# Patient Record
Sex: Female | Born: 1995 | Hispanic: Yes | Marital: Single | Smoking: Former smoker
Health system: Southern US, Community
[De-identification: ages and names within clinical notes are randomized; demographics above are authoritative.]

## PROBLEM LIST (undated history)

## (undated) ENCOUNTER — Inpatient Hospital Stay: Payer: Self-pay

## (undated) DIAGNOSIS — O139 Gestational [pregnancy-induced] hypertension without significant proteinuria, unspecified trimester: Secondary | ICD-10-CM

## (undated) DIAGNOSIS — O4290 Premature rupture of membranes, unspecified as to length of time between rupture and onset of labor, unspecified weeks of gestation: Secondary | ICD-10-CM

## (undated) DIAGNOSIS — O1400 Mild to moderate pre-eclampsia, unspecified trimester: Secondary | ICD-10-CM

## (undated) HISTORY — PX: NO PAST SURGERIES: SHX2092

## (undated) HISTORY — DX: Premature rupture of membranes, unspecified as to length of time between rupture and onset of labor, unspecified weeks of gestation: O42.90

## (undated) HISTORY — PX: CHOLECYSTECTOMY: SHX55

## (undated) HISTORY — DX: Mild to moderate pre-eclampsia, unspecified trimester: O14.00

---
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
## 2014-11-10 IMAGING — US US OB < 14 WEEKS
1 series · 14 of 28 positions shown · non-contrast
Comparison: none

REASON FOR EXAM: Fetal heart beat
COMMENTS:   May transport without cardiac monitor

[Series 1: us ob < 14 weeks · 0.23mm/px · 14 of 46 slices shown]
[im 2/46]
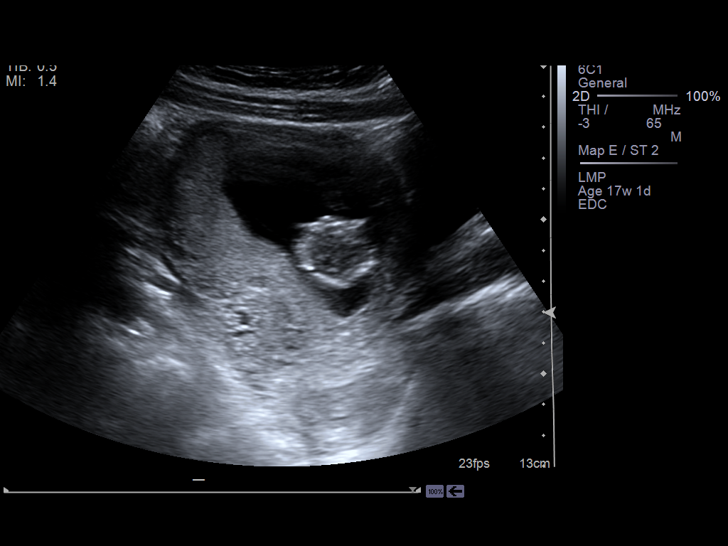
[im 6/46]
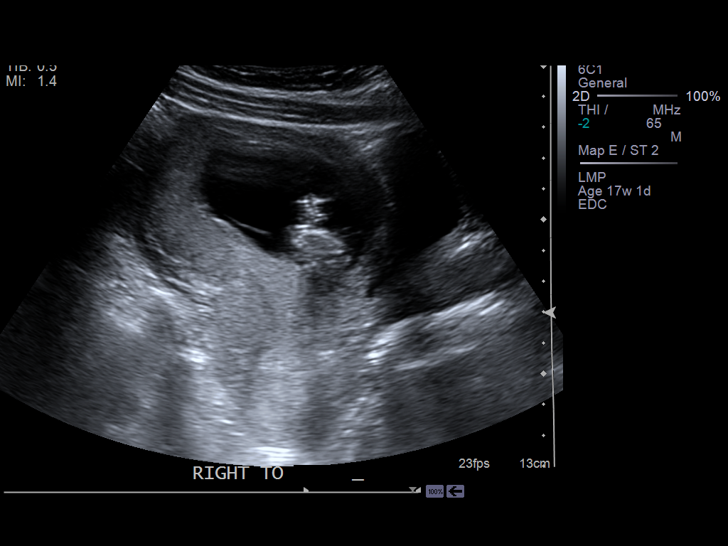
[im 9/46]
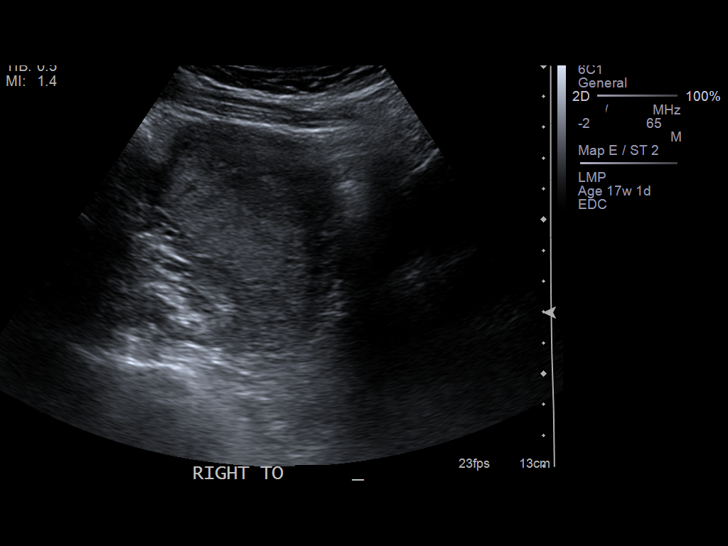
[im 12/46]
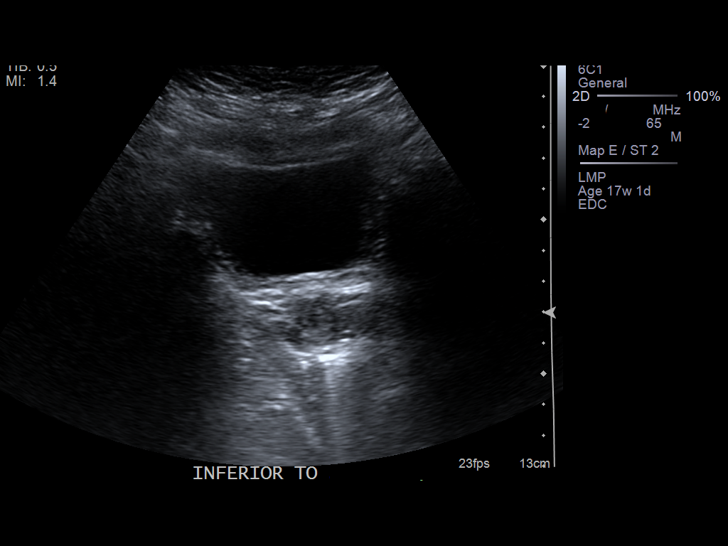
[im 16/46]
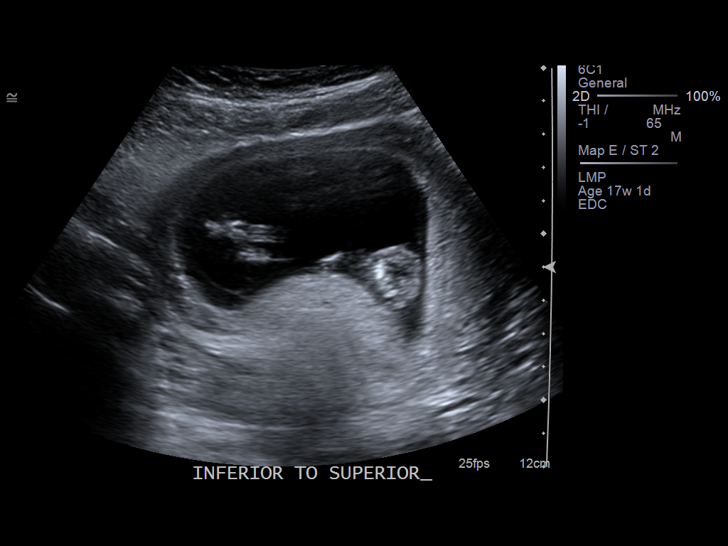
[im 19/46]
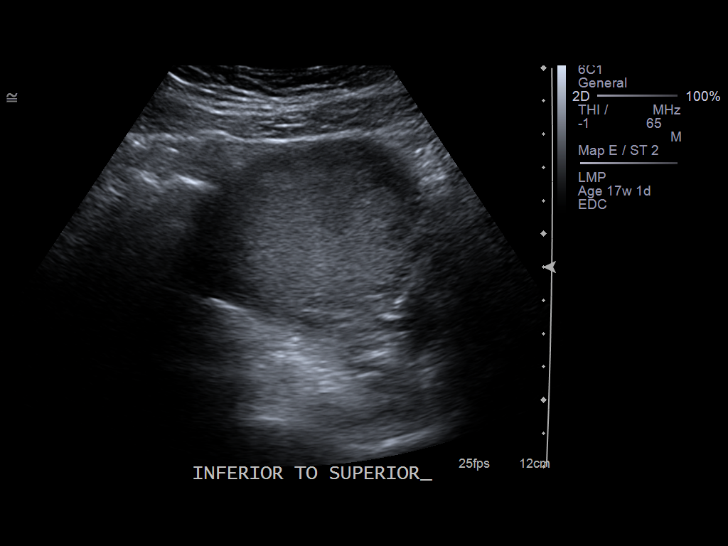
[im 22/46]
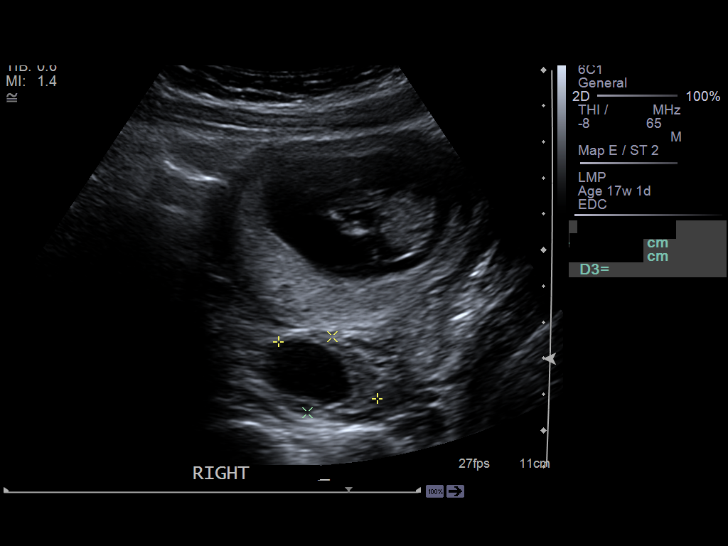
[im 26/46]
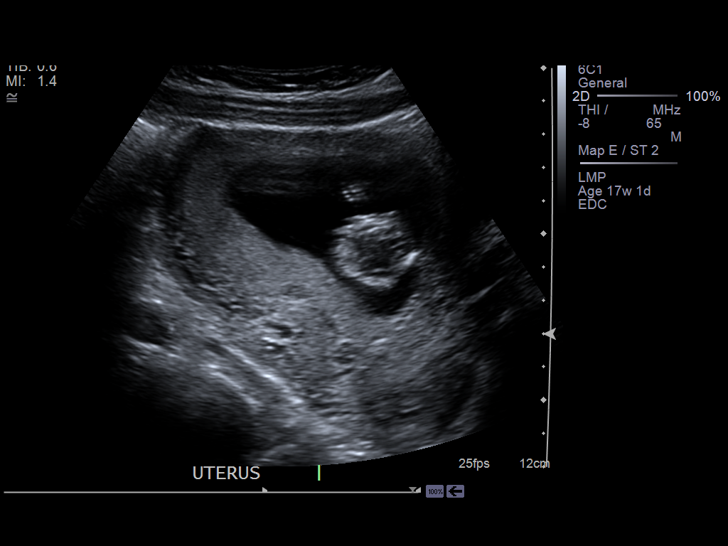
[im 29/46]
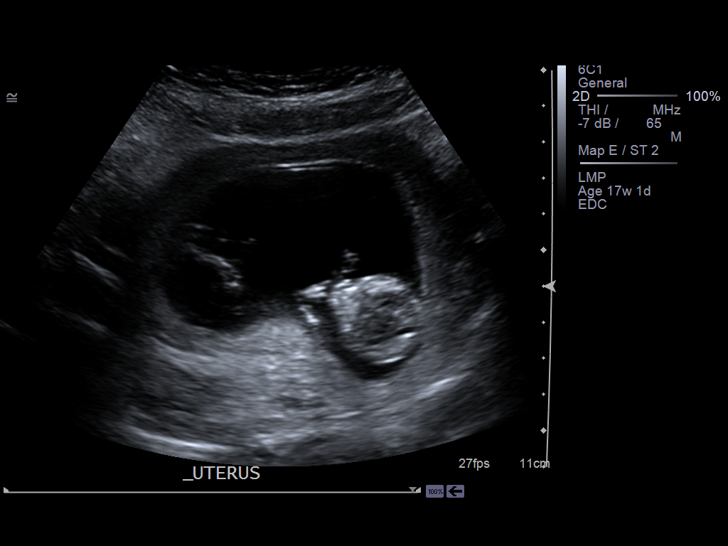
[im 32/46]
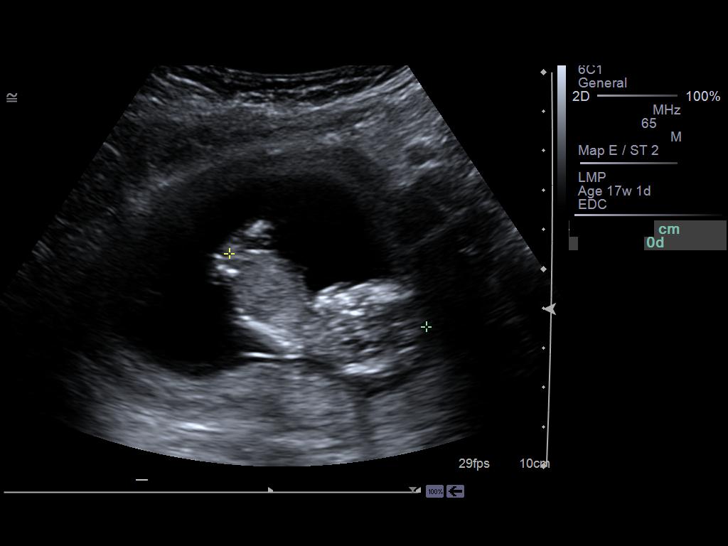
[im 36/46]
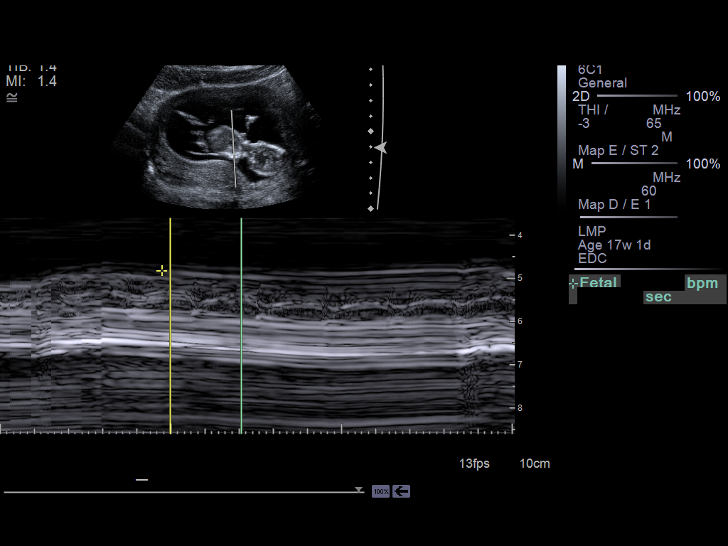
[im 39/46]
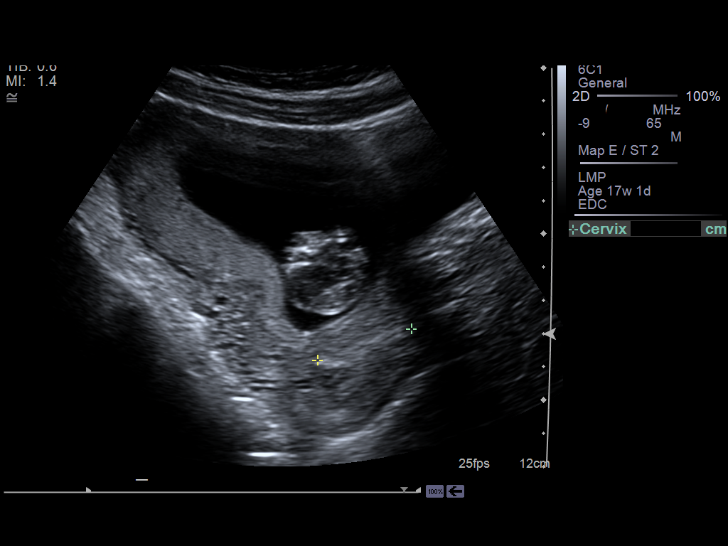
[im 42/46]
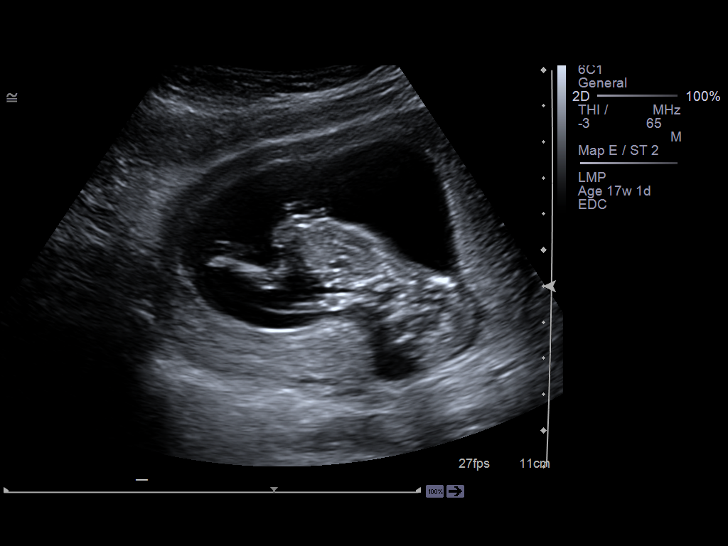
[im 46/46]
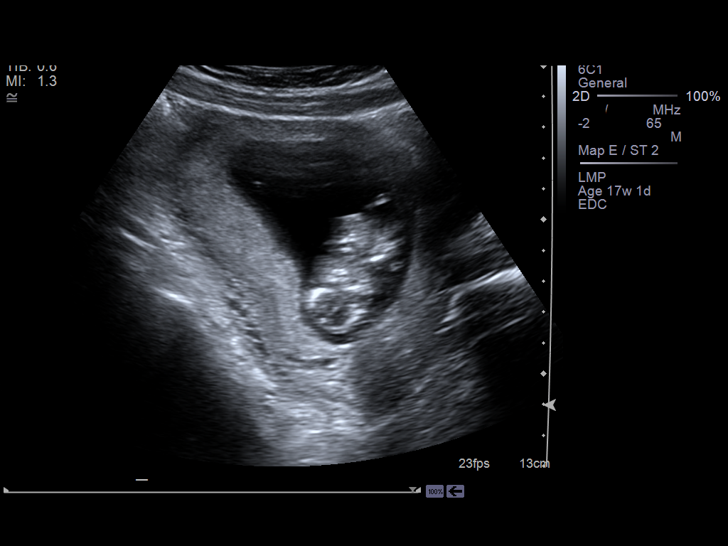

[14 of 28 positions shown; findings below may reference images not displayed]

PROCEDURE:     US  - US OB LESS THAN 14 WEEKS  - June 15, 2013  [DATE]

RESULT:     Transabdominal imaging was performed of the gravid uterus. There
is a viable IUP with a crown-rump length of 5.5 cm corresponding to 12 week
1 day gestation. A cardiac rate of 144 beats per minute was demonstrated. A
yolk sac is demonstrated as well. The maternal right ovary measures 3.2 x
2.2 x 2.6 cm and has an associated 2.5 x 1.6 x 1.7 cm diameter cyst. The
left ovary was not demonstrated. There is no free fluid in the cul-de-sac.
IMPRESSION: 1. There is a viable IUP with estimated gestational age of 12 weeks 1 day
+ / -8 days. This corresponds to an estimated date of confinement [DATE]. The maternal right ovary contains a 2.5 maximal dimension cyst. The left
ovary was not visualized.

A preliminary report was sent to the [HOSPITAL] the conclusion
of the study.

[REDACTED]

## 2014-12-23 NOTE — L&D Delivery Note (Cosign Needed)
Delivery Note At  a viable and healthy female was delivered via  (Presentation:LOA ;  ).  APGAR:8 ,9 ; weight  .  6#8oz Placenta status:delivered duncan with loose NCx1, and 3 vessel Cord:  with the following complications:lower uterine atony and increased bleeding, resolved with bimanual pressure and 2000mcg of cytotec rectally Anesthesia:none   Episiotomy:  none Lacerations:  none Suture Repair: NA Est. Blood Loss (mL):   400 Mom to postpartum.  Baby to Couplet care / Skin to Skin.  Melody N Shambley 12/10/2015, 2:50 PM

## 2015-05-02 NOTE — H&P (Signed)
L&D Evaluation:  History:  HPI 19yo SHF sent over from office for IOL at 2616w1d G1P0000 with mild preeclampsia and SROM 70h ago of scant amount clear fluid; denies fever, NTZ? and fern + in office, scant clear fluid noted on sterile speculum exam, MAD aware of findings and will assume care.   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  HEENT, CNS, GI, GU, Respiratory, CV, Renal and Musculoskeletal systems were found to be normal.   Exam:  Vital Signs stable   Urine Protein 4+   General no apparent distress   Mental Status clear   Chest clear   Heart normal sinus rhythm   Abdomen gravid, non-tender   Estimated Fetal Weight Average for gestational age   Fetal Position vtx   Back no CVAT   Edema 1+   Reflexes 2+   Clonus negative   Pelvic other, 2/60/-2   Mebranes Ruptured   Description clear   FHT normal rate with no decels   Fetal Heart Rate 126   Ucx irregular   Ucx Frequency 5 min   Length of each Contraction 60 seconds   Skin dry   Impression:  Impression reactive NST, SPROM at 39 weeks; mild preeclampsia   Plan:  Plan monitor contractions and for cervical change, PIH panel, antibiotics for GBBS prophylaxis, pitocin IOL   Electronic Signatures: Ines BloomerBurr, Melody N (CNM)  (Signed 26-Dec-14 14:32)  Authored: L&D Evaluation   Last Updated: 26-Dec-14 14:32 by Ulyses AmorBurr, Melody N (CNM)

## 2015-05-02 NOTE — H&P (Signed)
L&D Evaluation:  History:  HPI 19 y.o. G1P0 at 38.0 weeks, EDD 12/27/12 (LMP 03/13/13), elevated BP in office (health department 130/86), rule out PIH. Urine with 2+ protein   Presents with elevated BP, 2+  urine protein   Patient's Medical History No Chronic Illness   Patient's Surgical History none   Medications Pre Natal Vitamins   Allergies NKDA   Social History none   Family History Non-Contributory   ROS:  ROS intermittent contractions, mild x 3 days   General normal   HEENT normal   CNS normal   GI normal   GU normal   Resp normal   CV normal   Renal normal   MS normal   Exam:  Vital Signs stable   Urine Protein 2+   General no apparent distress   Mental Status clear   Chest clear   Heart normal sinus rhythm   Abdomen gravid, non-tender   Estimated Fetal Weight Average for gestational age   Fetal Position longitudinal lie,  cephalic   Back no CVAT   Edema no edema   Reflexes 2+   Clonus negative   Pelvic no external lesions, cervix  1cm/thick/posterior   Mebranes Intact   FHT normal rate with no decels   Fetal Heart Rate 155   Ucx irregular   Ucx Frequency -15 min   Skin dry   Lymph no lymphadenopathy   Impression:  Impression evaluation for PIH, reactive NST   Plan:  Plan UA, EFM/NST, monitor BP, PIH panel, discharge   Comments Urine protein 194. Protein/Cr ration 1254. PIH labs neg.  Uric acid 4.8.  BPs in triage 110s-120s/70s.  To perform 24 hr urine protein outpatient.  To f/u on Friday at [facility] for BP check as [facility] will be closed until the following Monday.  Plans to deliver with [facility].  Labor precautions and pre-eclampsia precautions given.   Follow Up Appointment in 1-3 days for BP check.   Electronic Signatures: Fabian Novemberherry, Depaul Arizpe S (MD)  (Signed 22-Dec-14 16:15)  Authored: L&D Evaluation   Last Updated: 22-Dec-14 16:15 by Fabian Novemberherry, Maliah Pyles S (MD)

## 2015-05-30 ENCOUNTER — Ambulatory Visit: Payer: Self-pay | Admitting: *Deleted

## 2015-05-30 VITALS — BP 119/74 | HR 79 | Ht 60.0 in | Wt 160.7 lb

## 2015-05-30 DIAGNOSIS — Z3491 Encounter for supervision of normal pregnancy, unspecified, first trimester: Secondary | ICD-10-CM

## 2015-05-30 LAB — OB RESULTS CONSOLE ABO/RH: RH TYPE: POSITIVE

## 2015-05-30 LAB — OB RESULTS CONSOLE RUBELLA ANTIBODY, IGM: RUBELLA: IMMUNE

## 2015-05-30 LAB — OB RESULTS CONSOLE VARICELLA ZOSTER ANTIBODY, IGG: VARICELLA IGG: IMMUNE

## 2015-05-30 NOTE — Progress Notes (Signed)
PT IS HERE FOR NOB NURSE INTAKE

## 2015-05-31 ENCOUNTER — Encounter: Payer: Self-pay | Admitting: Obstetrics and Gynecology

## 2015-06-01 LAB — PRENATAL PROFILE I(LABCORP)
ANTIBODY SCREEN: NEGATIVE
Basophils Absolute: 0 10*3/uL (ref 0.0–0.2)
Basos: 0 %
EOS (ABSOLUTE): 0.1 10*3/uL (ref 0.0–0.4)
Eos: 1 %
HEMATOCRIT: 35.2 % (ref 34.0–46.6)
Hemoglobin: 12.7 g/dL (ref 11.1–15.9)
Hepatitis B Surface Ag: NEGATIVE
Immature Grans (Abs): 0 10*3/uL (ref 0.0–0.1)
Immature Granulocytes: 0 %
Lymphocytes Absolute: 2 10*3/uL (ref 0.7–3.1)
Lymphs: 30 %
MCH: 30.4 pg (ref 26.6–33.0)
MCHC: 36.1 g/dL — ABNORMAL HIGH (ref 31.5–35.7)
MCV: 84 fL (ref 79–97)
Monocytes Absolute: 0.4 10*3/uL (ref 0.1–0.9)
Monocytes: 6 %
Neutrophils Absolute: 4.2 10*3/uL (ref 1.4–7.0)
Neutrophils: 63 %
PLATELETS: 210 10*3/uL (ref 150–379)
RBC: 4.18 x10E6/uL (ref 3.77–5.28)
RDW: 13.5 % (ref 12.3–15.4)
RPR: NONREACTIVE
Rh Factor: POSITIVE
Rubella Antibodies, IGG: 13 index (ref >0.99)
WBC: 6.7 10*3/uL (ref 3.4–10.8)

## 2015-06-01 LAB — MICROSCOPIC EXAMINATION
Bacteria, UA: NONE SEEN
Casts: NONE SEEN /lpf

## 2015-06-02 LAB — URINE CULTURE, REFLEX: Organism ID, Bacteria: NO GROWTH

## 2015-06-02 LAB — HGB A1C W/O EAG: Hgb A1c MFr Bld: 5.5 % (ref 4.8–5.6)

## 2015-06-02 LAB — ANTIBODY SCREEN: Antibody Screen: NEGATIVE

## 2015-06-02 LAB — UA/M W/RFLX CULTURE, ROUTINE

## 2015-06-02 LAB — VARICELLA ZOSTER ANTIBODY, IGG: Varicella zoster IgG: 1016 index (ref >165)

## 2015-06-04 LAB — GC/CHLAMYDIA PROBE AMP
Chlamydia trachomatis, NAA: NEGATIVE
NEISSERIA GONORRHOEAE BY PCR: NEGATIVE

## 2015-06-06 LAB — SPECIMEN STATUS REPORT

## 2015-06-09 ENCOUNTER — Encounter: Payer: Self-pay | Admitting: Obstetrics and Gynecology

## 2015-06-09 ENCOUNTER — Ambulatory Visit (INDEPENDENT_AMBULATORY_CARE_PROVIDER_SITE_OTHER): Payer: Self-pay | Admitting: Obstetrics and Gynecology

## 2015-06-09 VITALS — BP 103/69 | HR 85 | Wt 160.3 lb

## 2015-06-09 DIAGNOSIS — Z3492 Encounter for supervision of normal pregnancy, unspecified, second trimester: Secondary | ICD-10-CM

## 2015-06-09 DIAGNOSIS — O219 Vomiting of pregnancy, unspecified: Secondary | ICD-10-CM | POA: Insufficient documentation

## 2015-06-09 DIAGNOSIS — Z3482 Encounter for supervision of other normal pregnancy, second trimester: Secondary | ICD-10-CM

## 2015-06-09 DIAGNOSIS — E669 Obesity, unspecified: Secondary | ICD-10-CM

## 2015-06-09 DIAGNOSIS — O99210 Obesity complicating pregnancy, unspecified trimester: Secondary | ICD-10-CM | POA: Insufficient documentation

## 2015-06-09 DIAGNOSIS — Z8759 Personal history of other complications of pregnancy, childbirth and the puerperium: Secondary | ICD-10-CM

## 2015-06-09 DIAGNOSIS — O99212 Obesity complicating pregnancy, second trimester: Secondary | ICD-10-CM

## 2015-06-09 NOTE — Patient Instructions (Signed)
  Place 10-18 weeks prenatal visit patient instructions here.  

## 2015-06-09 NOTE — Progress Notes (Signed)
Pt is c/o nausea, took diclegis didn't feel like it helped

## 2015-06-09 NOTE — Progress Notes (Signed)
Subjective:  Lisa Dominguez is a 19 y.o. G2P1001 at [redacted]w[redacted]d being seen today for ongoing prenatal care.  Patient reports fatigue.daily nausea and vomiting relieved with Diclegis   .   .  .   The following portions of the patient's history were reviewed and updated as appropriate: allergies, current medications, past family history, past medical history, past social history, past surgical history and problem list. G2P1001 with previous pregnancy complicated by PIH with IOL at term and well vaginal delivery of female infant.  Objective:   Filed Vitals:   06/09/15 0921  BP: 103/69  Pulse: 85  Weight: 160 lb 4.8 oz (72.712 kg)    Fetal Status:  FHR 160s, movement heard         General:  Alert, oriented and cooperative. Patient is in no acute distress.  Skin: Skin is warm and dry. No rash noted.   Cardiovascular: Normal heart rate noted  Respiratory: Effort and breath sounds normal, no problems with respiration noted  Abdomen: Soft, gravid, appropriate for gestational age.       Vaginal:  .       Cervix: closed  Extremities: Normal range of motion.     Mental Status: Normal mood and affect. Normal behavior. Normal judgment and thought content.   Urinalysis:      Assessment and Plan:  Pregnancy: G2P1001 at [redacted]w[redacted]d  1. Prenatal care, second trimester H/O PIH at term- watch BPs Nausea & Vomiting of pregnancy- to continue Diclegis - POCT urinalysis dipstick Declines genetic screening   Please refer to After Visit Summary for other counseling recommendations.   F/U in 4 weeks for ROB visit  Melody Elissa Lovett, CNM

## 2015-07-07 ENCOUNTER — Encounter: Payer: Self-pay | Admitting: Obstetrics and Gynecology

## 2015-07-07 ENCOUNTER — Ambulatory Visit (INDEPENDENT_AMBULATORY_CARE_PROVIDER_SITE_OTHER): Payer: Self-pay | Admitting: Obstetrics and Gynecology

## 2015-07-07 VITALS — BP 109/69 | HR 85 | Wt 161.2 lb

## 2015-07-07 DIAGNOSIS — Z3402 Encounter for supervision of normal first pregnancy, second trimester: Secondary | ICD-10-CM

## 2015-07-07 LAB — POCT URINALYSIS DIPSTICK
Bilirubin, UA: NEGATIVE
Glucose, UA: NEGATIVE
Ketones, UA: NEGATIVE
NITRITE UA: NEGATIVE
PH UA: 6.5
Spec Grav, UA: 1.025
UROBILINOGEN UA: 1

## 2015-07-07 MED ORDER — DOXYLAMINE-PYRIDOXINE 10-10 MG PO TBEC: 1.0000 | DELAYED_RELEASE_TABLET | ORAL | Status: DC | PRN

## 2015-07-07 NOTE — Progress Notes (Signed)
ROB- doing better with nausea but still having to take Diclegis nightly- refill sent in; reports sinus pressure and clear nasal drainage- to take tylenol cold & sinus prn; anatomy scan scheduled.

## 2015-07-25 ENCOUNTER — Other Ambulatory Visit: Payer: Self-pay

## 2015-07-25 ENCOUNTER — Ambulatory Visit: Payer: Medicaid Other

## 2015-07-25 DIAGNOSIS — Z3402 Encounter for supervision of normal first pregnancy, second trimester: Secondary | ICD-10-CM

## 2015-08-04 ENCOUNTER — Encounter: Payer: Self-pay | Admitting: Obstetrics and Gynecology

## 2015-08-08 ENCOUNTER — Ambulatory Visit (INDEPENDENT_AMBULATORY_CARE_PROVIDER_SITE_OTHER): Payer: Self-pay | Admitting: Obstetrics and Gynecology

## 2015-08-08 VITALS — BP 108/67 | HR 86 | Wt 164.0 lb

## 2015-08-08 DIAGNOSIS — Z369 Encounter for antenatal screening, unspecified: Secondary | ICD-10-CM

## 2015-08-08 DIAGNOSIS — Z1389 Encounter for screening for other disorder: Secondary | ICD-10-CM

## 2015-08-08 DIAGNOSIS — Z331 Pregnant state, incidental: Secondary | ICD-10-CM

## 2015-08-08 DIAGNOSIS — Z36 Encounter for antenatal screening of mother: Secondary | ICD-10-CM

## 2015-08-08 LAB — POCT URINALYSIS DIPSTICK
Bilirubin, UA: NEGATIVE
Blood, UA: NEGATIVE
GLUCOSE UA: NEGATIVE
Ketones, UA: NEGATIVE
NITRITE UA: NEGATIVE
Protein, UA: NEGATIVE
Spec Grav, UA: 1.015
Urobilinogen, UA: NEGATIVE
pH, UA: 7

## 2015-08-08 NOTE — Progress Notes (Signed)
ROB- reviewed anatomy scan done 2 weeks ago with normal findings: Indications:Anatomy U/S Findings:  Singleton intrauterine pregnancy is visualized with FHR at 140 BPM. Biometrics give an (U/S) Gestational age of 81 5/7 weeks and an (U/S) EDD of 12-21-15; this correlates with the clinically established EDD of 12-15-15.  Fetal presentation is Oblique..  EFW: 9 oz. Placenta: Posterior-fundal. AFI: Adequate.  Anatomic survey is complete and normal; Gender - female .     Impression: 1. 18 5/7 week Viable Singleton Intrauterine pregnancy by U/S. 2. (U/S) EDD is consistent with Clinically established (LMP) EDD of 12-15-15. (6 day difference). 3. Normal Anatomy Scan  Doing well, needs to increase water intake.

## 2015-09-05 ENCOUNTER — Ambulatory Visit (INDEPENDENT_AMBULATORY_CARE_PROVIDER_SITE_OTHER): Payer: Medicaid Other | Admitting: Obstetrics and Gynecology

## 2015-09-05 ENCOUNTER — Encounter: Payer: Self-pay | Admitting: Obstetrics and Gynecology

## 2015-09-05 VITALS — BP 110/69 | HR 84 | Wt 169.6 lb

## 2015-09-05 DIAGNOSIS — Z3493 Encounter for supervision of normal pregnancy, unspecified, third trimester: Secondary | ICD-10-CM | POA: Diagnosis not present

## 2015-09-05 MED ORDER — INFLUENZA VAC SPLIT QUAD 0.5 ML IM SUSY
0.5000 mL | PREFILLED_SYRINGE | Freq: Once | INTRAMUSCULAR | Status: AC
  Administered 2015-09-05: 0.5 mL via INTRAMUSCULAR

## 2015-09-05 NOTE — Progress Notes (Signed)
ROB- denies any new complaints, flu shot given 09/05/15

## 2015-09-05 NOTE — Progress Notes (Signed)
ROB-doing well, glucola next visit. 

## 2015-09-10 ENCOUNTER — Observation Stay
Admission: EM | Admit: 2015-09-10 | Discharge: 2015-09-10 | Disposition: A | Discharge status: Home / Self Care | Payer: Medicaid Other | Attending: Obstetrics and Gynecology | Admitting: Obstetrics and Gynecology

## 2015-09-10 DIAGNOSIS — Z3A26 26 weeks gestation of pregnancy: Secondary | ICD-10-CM | POA: Insufficient documentation

## 2015-09-10 DIAGNOSIS — O26892 Other specified pregnancy related conditions, second trimester: Principal | ICD-10-CM | POA: Insufficient documentation

## 2015-09-10 DIAGNOSIS — R109 Unspecified abdominal pain: Secondary | ICD-10-CM | POA: Diagnosis present

## 2015-09-10 DIAGNOSIS — R1033 Periumbilical pain: Secondary | ICD-10-CM | POA: Diagnosis present

## 2015-09-10 HISTORY — DX: Gestational (pregnancy-induced) hypertension without significant proteinuria, unspecified trimester: O13.9

## 2015-09-10 NOTE — OB Triage Note (Signed)
Discharged home.  Discussed use of abdominal band to help support umbilical area.  Patient is going to go purchase one.  Will follow-up in two weeks with Zoraida Havrilla.

## 2015-09-10 NOTE — OB Triage Note (Signed)
19 y.o. female presents today with complaint of pain in her umbilical area.  States that this started last night when she was cooking she leaned over the oven and her belly button hit it, which made it more sore. Her umbilical area has been protruding slightly for a couple weeks per the patient. States that this is a 7/10 on pain scale. She states that makes laying down makes it better and standing makes it worse. At home has tried nothing to make it better.  Denies abdominal pain, bleeding, leaking of fluid, or decreased fetal movement.  On assessment umbilicus slightly protruding and tender to touch.  Discussed with patient that wearing a belly band can help with this. Spoke with Melody Burr CNM and orders received to discharge home.

## 2015-09-10 NOTE — ED Notes (Signed)
Called and talked with Daniel Nones in L&D,  Because of pts c/o of decreased fetal movement today pt will need to go to L&D.

## 2015-09-10 NOTE — ED Notes (Signed)
Pt is 26 weeks, 2 days pregnant.  Here with c/o pain around her umbilicus.  She reports that she leaned in over the stove last night pressing on her abdomen, and the pain started after that.   She states that today the pain is worse, and pain is especially worse with movement.   No bruising noted, pt denies nausea, vomiting.   She has felt some fetal movement today, but not as much as usual.  FHT 144.

## 2015-09-27 ENCOUNTER — Encounter: Payer: Self-pay | Admitting: Obstetrics and Gynecology

## 2015-09-27 ENCOUNTER — Ambulatory Visit (INDEPENDENT_AMBULATORY_CARE_PROVIDER_SITE_OTHER): Payer: Medicaid Other | Admitting: Obstetrics and Gynecology

## 2015-09-27 VITALS — BP 109/65 | HR 97 | Wt 172.5 lb

## 2015-09-27 DIAGNOSIS — Z23 Encounter for immunization: Secondary | ICD-10-CM

## 2015-09-27 DIAGNOSIS — Z3493 Encounter for supervision of normal pregnancy, unspecified, third trimester: Secondary | ICD-10-CM | POA: Diagnosis not present

## 2015-09-27 DIAGNOSIS — Z131 Encounter for screening for diabetes mellitus: Secondary | ICD-10-CM

## 2015-09-27 MED ORDER — TETANUS-DIPHTH-ACELL PERTUSSIS 5-2.5-18.5 LF-MCG/0.5 IM SUSP
0.5000 mL | Freq: Once | INTRAMUSCULAR | Status: AC
  Administered 2015-09-27: 0.5 mL via INTRAMUSCULAR

## 2015-09-27 NOTE — Patient Instructions (Signed)
Vacuna Td (contra la difteria y el ttanos): Lo que debe saber (Td Vaccine [place] and Diphtheria]: What You Need to Know) 1. Por qu vacunarse? El ttanos y la difteria son enfermedades muy graves. Son Academic librarian frecuentes en los [place] actualmente, pero las personas que se infectan suelen tener complicaciones graves. La vacuna Td se [place] para proteger a los adolescentes y a los adultos de ambas enfermedades. Tanto el ttanos como la difteria son infecciones causadas por bacterias. La difteria se transmite de persona a persona a travs de la tos o el estornudo. La bacteria que causa el ttanos entra al cuerpo a travs de cortes, raspones o heridas. El TTANOS (trismo) provoca entumecimiento y Engineer, materials dolorosa de los msculos, por lo general, en todo el cuerpo.  Puede causar el endurecimiento de los msculos de la cabeza y el cuello, de modo que impide abrir la boca, tragar y en algunos casos, Industrial/product designer. El ttanos es causa de muerte en aproximadamente 1de cada 10personas que contraen la infeccin, incluso despus de que reciben la mejor atencin mdica. La DIFTERIA puede hacer que se forme una membrana gruesa en la parte posterior de la garganta.  Puede causar problemas respiratorios, parlisis, insuficiencia cardaca e incluso la muerte. Antes de las vacunas, en los [place] se informaban 200000 casos de difteria y cientos de casos de ttanos cada ao. Desde que comenz la vacunacin, los informes de casos de ambas enfermedades se han reducido en un 99%. 2. Lisa Dominguez Td La vacuna Td protege a adolescentes y adultos contra el ttanos y la difteria. La vacuna Td habitualmente se aplica como dosis de refuerzo cada 10aos, pero tambin puede administrarse antes si la persona sufre una [place] o herida sucia y grave. A veces, en lugar de la vacuna Td, se recomienda una vacuna llamada Tdap, que protege contra la tosferina, adems de proteger contra el ttanos y la difteria. El mdico o la  persona que le aplique la vacuna puede darle ms informacin al [employer]. La Td puede administrarse de manera segura simultneamente con otras vacunas. 3. Algunas personas no deben recibir la vacuna  Una persona que alguna vez ha tenido una reaccin alrgica potencialmente mortal a una dosis anterior de cualquier vacuna contra el ttanos o la difteria, O que tenga una alergia grave a cualquier parte de esta vacuna, no debe recibir la vacuna Td. Informe a la persona que le aplica la vacuna si usted tiene cualquier alergia grave.  Consulte con su mdico si:  tiene convulsiones u otro problema del sistema nervioso,  tuvo hinchazn o dolor intenso despus de recibir cualquier vacuna contra la difteria o el ttanos,  alguna vez ha sufrido el sndrome de [place],  no se siente Research scientist (life sciences) en que se ha programado la vacuna. 4. Riesgos de [place] reaccin a la vacuna Con cualquier medicamento, incluyendo las vacunas, existe la posibilidad de que aparezcan efectos secundarios. Suelen ser leves y desaparecen por s solos. Tambin son posibles las reacciones graves, pero en raras ocasiones. La [employer] de las personas a las que se les aplica la vacuna Td no tienen ningn problema. Problemas leves despus de la vacuna Td: (No interfirieron en otras actividades)  Dolor en el lugar donde se aplic la vacuna (alrededor de 8de cada 10personas)  Enrojecimiento o hinchazn en el lugar donde se aplic la vacuna (alrededor de 1de cada 4personas)  Fiebre leve (poco frecuente)  Dolor de Training and development officer (alrededor de 1de cada 4personas)  Cansancio (alrededor de 1de cada 4personas) Problemas moderados despus  de la vacuna Td: (Interfirieron en otras actividades, pero no requirieron atencin mdica)  [place] superior a 102F (38,8C) (poco frecuente) Problemas graves despus de la vacuna Td: (Impidieron Education officer, environmental las actividades habituales; requirieron atencin mdica)  Buyer, retail, dolor intenso,  sangrado o enrojecimiento en el brazo en que se aplic la vacuna (poco frecuente). Problemas que podran ocurrir despus de cualquier vacuna:  Las personas a veces se desmayan despus de un procedimiento mdico, incluida la vacunacin. Si permanece sentado o recostado durante 15 minutos puede ayudar a [employer] y las lesiones causadas por las cadas. Informe al mdico si se siente mareado, tiene cambios en la visin o zumbidos en los odos.  Algunas personas sienten un dolor intenso en el hombro y tienen dificultad para mover el brazo donde se coloc la vacuna. Esto sucede con muy poca frecuencia.  Cualquier medicamento puede causar una reaccin alrgica grave. Dichas reacciones son Lynnae Sandhoff poco frecuentes con una vacuna (se calcula que menos de 1en un milln de dosis) y se producen de unos minutos a unas horas despus de Arts development officer. Al igual que con cualquier [employer] Data, existe una probabilidad muy remota de que una vacuna cause una lesin grave o la [place]. Se controla permanentemente la seguridad de las vacunas. Para obtener ms informacin, visite: http://floyd.org/. 5. Qu pasa si hay una reaccin grave? A qu signos debo estar atento?  Observe todo lo que le preocupe, como signos de una reaccin alrgica grave, fiebre muy alta o comportamiento fuera de lo normal. Los signos de una reaccin alrgica grave pueden incluir ronchas, hinchazn de la cara y la garganta, dificultad para respirar, latidos cardacos acelerados, mareos y debilidad. Generalmente, estos comenzaran entre unos pocos minutos y algunas horas despus de la vacunacin. Qu debo hacer?  Si usted piensa que se trata de una reaccin alrgica grave o de otra emergencia que no puede esperar, llame al 911 o dirjase al hospital ms cercano. Sino, llame a su mdico.  Despus, la reaccin debe informarse al [address] de Informacin sobre Efectos Adversos de las [place] (Vaccine Adverse Event Reporting System,  VAERS). Su mdico puede presentar este informe, o puede hacerlo usted mismo a travs del sitio web de VAERS, en www.vaers.LAgents.no, o llamando al ([phone]. VAERS no brinda recomendaciones mdicas. 6. [employer] de Compensacin de Daos por [employer] [place] de Compensacin de Daos por Administrator, arts (National Vaccine Injury Compensation Program, VICP) es un programa federal que fue creado para Patent examiner a las personas que puedan haber sufrido daos al recibir ciertas vacunas. Aquellas personas que consideren que han sufrido un dao como consecuencia de una vacuna y [place] saber ms acerca del programa y de cmo presentar Roslynn Amble, pueden llamar al [phone] o visitar su sitio web en [email or web address].at. Hay un lmite de tiempo para presentar un reclamo de compensacin. 7. Cmo puedo obtener ms informacin?  Consulte a su mdico. Este puede darle el prospecto de la vacuna o recomendarle otras fuentes de informacin.  Comunquese con el servicio de salud de su localidad o [address].  Comunquese con los Centros para Air traffic controller y la Prevencin de Child psychotherapist for Disease Control and Prevention , CDC).  Llame al [phone] (1-800-CDC-INFO).  Visite el sitio Environmental manager en [email or web address]. Declaracin de informacin sobre la vacuna contra la difteria y el ttanos (Td) de los CDC (02/15/14)   Esta informacin no tiene Theme park manager el consejo del mdico. Asegrese de hacerle al mdico cualquier pregunta que tenga.   Document Released: 03/27/2009 Document  Revised: 12/30/2014 Elsevier Interactive Patient Education [employer] Third Trimester of Pregnancy The third trimester is from week 29 through week 42, months 7 through 9. The third trimester is a time when the fetus is growing rapidly. At the end of the ninth month, the fetus is about 20 inches in length and weighs 6-10 pounds.  BODY CHANGES Your body goes through many  changes during pregnancy. The changes vary from woman to woman.   Your weight will continue to increase. You can expect to gain 25-35 pounds (11-16 kg) by the end of the pregnancy.  You may begin to get stretch marks on your hips, abdomen, and breasts.  You may urinate more often because the fetus is moving lower into your pelvis and pressing on your bladder.  You may develop or continue to have heartburn as a result of your pregnancy.  You may develop constipation because certain hormones are causing the muscles that push waste through your intestines to slow down.  You may develop hemorrhoids or swollen, bulging veins (varicose veins).  You may have pelvic pain because of the weight gain and pregnancy hormones relaxing your joints between the bones in your pelvis. Backaches may result from overexertion of the muscles supporting your posture.  You may have changes in your hair. These can include thickening of your hair, rapid growth, and changes in texture. Some women also have hair loss during or after pregnancy, or hair that feels dry or thin. Your hair will most likely return to normal after your baby is born.  Your breasts will continue to grow and be tender. A yellow discharge may leak from your breasts called colostrum.  Your belly button may stick out.  You may feel short of breath because of your expanding uterus.  You may notice the fetus "dropping," or moving lower in your abdomen.  You may have a bloody mucus discharge. This usually occurs a few days to a week before labor begins.  Your cervix becomes thin and soft (effaced) near your due date. WHAT TO EXPECT AT YOUR PRENATAL EXAMS  You will have prenatal exams every 2 weeks until week 36. Then, you will have weekly prenatal exams. During a routine prenatal visit:  You will be weighed to make sure you and the fetus are growing normally.  Your blood pressure is taken.  Your abdomen will be measured to track your baby's  growth.  The fetal heartbeat will be listened to.  Any test results from the previous visit will be discussed.  You may have a cervical check near your due date to see if you have effaced. At around 36 weeks, your caregiver will check your cervix. At the same time, your caregiver will also perform a test on the secretions of the vaginal tissue. This test is to determine if a type of bacteria, Group B streptococcus, is present. Your caregiver will explain this further. Your caregiver may ask you:  What your birth plan is.  How you are feeling.  If you are feeling the baby move.  If you have had any abnormal symptoms, such as leaking fluid, bleeding, severe headaches, or abdominal cramping.  If you are using any tobacco products, including cigarettes, chewing tobacco, and electronic cigarettes.  If you have any questions. Other tests or screenings that may be performed during your third trimester include:  Blood tests that check for low iron levels (anemia).  Fetal testing to check the health, activity level, and growth of the fetus. Testing  is done if you have certain medical conditions or if there are problems during the pregnancy.  HIV (human immunodeficiency virus) testing. If you are at high risk, you may be screened for HIV during your third trimester of pregnancy. FALSE LABOR You may feel small, irregular contractions that eventually go away. These are called Braxton Hicks contractions, or false labor. Contractions may last for hours, days, or even weeks before true labor sets in. If contractions come at regular intervals, intensify, or become painful, it is best to be seen by your caregiver.  SIGNS OF LABOR   Menstrual-like cramps.  Contractions that are 5 minutes apart or less.  Contractions that start on the top of the uterus and spread down to the lower abdomen and back.  A sense of increased pelvic pressure or back pain.  A watery or bloody mucus discharge that comes  from the vagina. If you have any of these signs before the 37th week of pregnancy, call your caregiver right away. You need to go to the hospital to get checked immediately. HOME CARE INSTRUCTIONS   Avoid all smoking, herbs, alcohol, and unprescribed drugs. These chemicals affect the formation and growth of the baby.  Do not use any tobacco products, including cigarettes, chewing tobacco, and electronic cigarettes. If you need help quitting, ask your health care provider. You may receive counseling support and other resources to help you quit.  Follow your caregiver's instructions regarding medicine use. There are medicines that are either safe or unsafe to take during pregnancy.  Exercise only as directed by your caregiver. Experiencing uterine cramps is a good sign to stop exercising.  Continue to eat regular, healthy meals.  Wear a good support bra for breast tenderness.  Do not use hot tubs, steam rooms, or saunas.  Wear your seat belt at all times when driving.  Avoid raw meat, uncooked cheese, cat litter boxes, and soil used by cats. These carry germs that can cause birth defects in the baby.  Take your prenatal vitamins.  Take 1500-2000 mg of calcium daily starting at the 20th week of pregnancy until you deliver your baby.  Try taking a stool softener (if your caregiver approves) if you develop constipation. Eat more high-fiber foods, such as fresh vegetables or fruit and whole grains. Drink plenty of fluids to keep your urine clear or pale yellow.  Take warm sitz baths to soothe any pain or discomfort caused by hemorrhoids. Use hemorrhoid cream if your caregiver approves.  If you develop varicose veins, wear support hose. Elevate your feet for 15 minutes, 3-4 times a day. Limit salt in your diet.  Avoid heavy lifting, wear low heal shoes, and practice good posture.  Rest a lot with your legs elevated if you have leg cramps or low back pain.  Visit your dentist if you have  not gone during your pregnancy. Use a soft toothbrush to brush your teeth and be gentle when you floss.  A sexual relationship may be continued unless your caregiver directs you otherwise.  Do not travel far distances unless it is absolutely necessary and only with the approval of your caregiver.  Take prenatal classes to understand, practice, and ask questions about the labor and delivery.  Make a trial run to the hospital.  Pack your hospital bag.  Prepare the baby's nursery.  Continue to go to all your prenatal visits as directed by your caregiver. SEEK MEDICAL CARE IF:  You are unsure if you are in labor or if your  water has broken.  You have dizziness.  You have mild pelvic cramps, pelvic pressure, or nagging pain in your abdominal area.  You have persistent nausea, vomiting, or diarrhea.  You have a bad smelling vaginal discharge.  You have pain with urination. SEEK IMMEDIATE MEDICAL CARE IF:   You have a fever.  You are leaking fluid from your vagina.  You have spotting or bleeding from your vagina.  You have severe abdominal cramping or pain.  You have rapid weight loss or gain.  You have shortness of breath with chest pain.  You notice sudden or extreme swelling of your face, hands, ankles, feet, or legs.  You have not felt your baby move in over an hour.  You have severe headaches that do not go away with medicine.  You have vision changes.   This information is not intended to replace advice given to you by your health care provider. Make sure you discuss any questions you have with your health care provider.   Document Released: 12/03/2001 Document Revised: 12/30/2014 Document Reviewed: 02/09/2013 Elsevier Interactive Patient Education [employer]

## 2015-09-27 NOTE — Progress Notes (Signed)
ROB- denies any new complaints 

## 2015-09-27 NOTE — Progress Notes (Signed)
ROB-Tdap given and blood consent signed, info on cord blood donation given; umbilical hernia noted- recommended maternity belt.

## 2015-09-28 ENCOUNTER — Other Ambulatory Visit: Payer: Self-pay | Admitting: Obstetrics and Gynecology

## 2015-09-28 DIAGNOSIS — R7309 Other abnormal glucose: Secondary | ICD-10-CM

## 2015-09-28 DIAGNOSIS — O99013 Anemia complicating pregnancy, third trimester: Secondary | ICD-10-CM | POA: Insufficient documentation

## 2015-09-28 LAB — GLUCOSE, 1 HOUR GESTATIONAL: GESTATIONAL DIABETES SCREEN: 178 mg/dL — AB (ref 65–139)

## 2015-09-28 LAB — HEMOGLOBIN AND HEMATOCRIT, BLOOD
Hematocrit: 31.4 % — ABNORMAL LOW (ref 34.0–46.6)
Hemoglobin: 10.6 g/dL — ABNORMAL LOW (ref 11.1–15.9)

## 2015-09-28 MED ORDER — FUSION PLUS PO CAPS: 1.0000 | ORAL_CAPSULE | Freq: Every day | ORAL | Status: DC

## 2015-10-03 ENCOUNTER — Telehealth: Payer: Self-pay | Admitting: *Deleted

## 2015-10-03 NOTE — Telephone Encounter (Signed)
-----   Message from Ulyses Amor, [place] sent at 09/28/2015  1:58 PM EDT ----- Please let her know she failed her glucola and is anemic- I sent in order for fusion plus, and ordered 3hGTT- needs to come in ASAP for retest fasting

## 2015-10-03 NOTE — Telephone Encounter (Signed)
Notified pt of results, she will come in 10-04-15 for 3 hr

## 2015-10-04 ENCOUNTER — Other Ambulatory Visit: Payer: Medicaid Other

## 2015-10-04 ENCOUNTER — Other Ambulatory Visit: Payer: Self-pay | Admitting: Obstetrics and Gynecology

## 2015-10-04 DIAGNOSIS — R7309 Other abnormal glucose: Secondary | ICD-10-CM

## 2015-10-05 LAB — GESTATIONAL GLUCOSE TOLERANCE
GLUCOSE 1 HOUR GTT: 176 mg/dL (ref 65–179)
GLUCOSE 2 HOUR GTT: 87 mg/dL (ref 65–154)
GLUCOSE FASTING: 90 mg/dL (ref 65–94)
Glucose, GTT - 3 Hour: 79 mg/dL (ref 65–139)

## 2015-10-09 ENCOUNTER — Telehealth: Payer: Self-pay | Admitting: *Deleted

## 2015-10-09 NOTE — Telephone Encounter (Signed)
-----   Message from Ulyses AmorMelody N Burr, [place] sent at 10/06/2015  8:10 AM EDT ----- Please let her know she passed her three hour GTT, no diabetes

## 2015-10-09 NOTE — Telephone Encounter (Signed)
Notified pt of lab results 

## 2015-10-18 ENCOUNTER — Ambulatory Visit (INDEPENDENT_AMBULATORY_CARE_PROVIDER_SITE_OTHER): Payer: Medicaid Other | Admitting: Obstetrics and Gynecology

## 2015-10-18 ENCOUNTER — Encounter: Payer: Self-pay | Admitting: Obstetrics and Gynecology

## 2015-10-18 VITALS — BP 111/79 | HR 91 | Wt 178.4 lb

## 2015-10-18 DIAGNOSIS — Z3493 Encounter for supervision of normal pregnancy, unspecified, third trimester: Secondary | ICD-10-CM

## 2015-10-18 NOTE — Progress Notes (Signed)
ROB-doing better, HA occuring when hungry

## 2015-10-18 NOTE — Progress Notes (Signed)
ROB-pt is having some headaches hasn't taken anything for the headaches Low pelvic pressure

## 2015-10-31 ENCOUNTER — Ambulatory Visit (INDEPENDENT_AMBULATORY_CARE_PROVIDER_SITE_OTHER): Payer: Medicaid Other | Admitting: Obstetrics and Gynecology

## 2015-10-31 ENCOUNTER — Encounter: Payer: Self-pay | Admitting: Obstetrics and Gynecology

## 2015-10-31 VITALS — BP 111/73 | HR 82 | Wt 178.9 lb

## 2015-10-31 DIAGNOSIS — Z3493 Encounter for supervision of normal pregnancy, unspecified, third trimester: Secondary | ICD-10-CM

## 2015-10-31 NOTE — Progress Notes (Signed)
ROB- c/o back pains occasionally; cultures next visit

## 2015-10-31 NOTE — Progress Notes (Signed)
Lisa Dominguez- pt states she is very tired, hasn't been taking her iron tablets, however she is going to get them today

## 2015-10-31 NOTE — Patient Instructions (Signed)
Etonogestrel implant What is this medicine? ETONOGESTREL (et oh noe JES trel) is a contraceptive (birth control) device. It is used to prevent pregnancy. It can be used for up to 3 years. This medicine may be used for other purposes; ask your health care provider or pharmacist if you have questions. What should I tell my health care provider before I take this medicine? They need to know if you have any of these conditions: -abnormal vaginal bleeding -blood vessel disease or blood clots -cancer of the breast, cervix, or liver -depression -diabetes -gallbladder disease -headaches -heart disease or recent heart attack -high blood pressure -high cholesterol -kidney disease -liver disease -renal disease -seizures -tobacco smoker -an unusual or allergic reaction to etonogestrel, other hormones, anesthetics or antiseptics, medicines, foods, dyes, or preservatives -pregnant or trying to get pregnant -breast-feeding How should I use this medicine? This device is inserted just under the skin on the inner side of your upper arm by a health care professional. Talk to your pediatrician regarding the use of this medicine in children. Special care may be needed. Overdosage: If you think you have taken too much of this medicine contact a poison control center or emergency room at once. NOTE: This medicine is only for you. Do not share this medicine with others. What if I miss a dose? This does not apply. What may interact with this medicine? Do not take this medicine with any of the following medications: -amprenavir -bosentan -fosamprenavir This medicine may also interact with the following medications: -barbiturate medicines for inducing sleep or treating seizures -certain medicines for fungal infections like ketoconazole and itraconazole -griseofulvin -medicines to treat seizures like carbamazepine, felbamate, oxcarbazepine, phenytoin,  topiramate -modafinil -phenylbutazone -rifampin -some medicines to treat HIV infection like atazanavir, indinavir, lopinavir, nelfinavir, tipranavir, ritonavir -St. John's wort This list may not describe all possible interactions. Give your health care provider a list of all the medicines, herbs, non-prescription drugs, or dietary supplements you use. Also tell them if you smoke, drink alcohol, or use illegal drugs. Some items may interact with your medicine. What should I watch for while using this medicine? This product does not protect you against HIV infection (AIDS) or other sexually transmitted diseases. You should be able to feel the implant by pressing your fingertips over the skin where it was inserted. Contact your doctor if you cannot feel the implant, and use a non-hormonal birth control method (such as condoms) until your doctor confirms that the implant is in place. If you feel that the implant may have broken or become bent while in your arm, contact your healthcare provider. What side effects may I notice from receiving this medicine? Side effects that you should report to your doctor or health care professional as soon as possible: -allergic reactions like skin rash, itching or hives, swelling of the face, lips, or tongue -breast lumps -changes in emotions or moods -depressed mood -heavy or prolonged menstrual bleeding -pain, irritation, swelling, or bruising at the insertion site -scar at site of insertion -signs of infection at the insertion site such as fever, and skin redness, pain or discharge -signs of pregnancy -signs and symptoms of a blood clot such as breathing problems; changes in vision; chest pain; severe, sudden headache; pain, swelling, warmth in the leg; trouble speaking; sudden numbness or weakness of the face, arm or leg -signs and symptoms of liver injury like dark yellow or brown urine; general ill feeling or flu-like symptoms; light-colored stools; loss of  appetite; nausea; right upper belly   pain; unusually weak or tired; yellowing of the eyes or skin -unusual vaginal bleeding, discharge -signs and symptoms of a stroke like changes in vision; confusion; trouble speaking or understanding; severe headaches; sudden numbness or weakness of the face, arm or leg; trouble walking; dizziness; loss of balance or coordination Side effects that usually do not require medical attention (Report these to your doctor or health care professional if they continue or are bothersome.): -acne -back pain -breast pain -changes in weight -dizziness -general ill feeling or flu-like symptoms -headache -irregular menstrual bleeding -nausea -sore throat -vaginal irritation or inflammation This list may not describe all possible side effects. Call your doctor for medical advice about side effects. You may report side effects to FDA at 1-800-FDA-1088. Where should I keep my medicine? This drug is given in a hospital or clinic and will not be stored at home. NOTE: This sheet is a summary. It may not cover all possible information. If you have questions about this medicine, talk to your doctor, pharmacist, or health care provider.    2016, Elsevier/Gold Standard. (2014-09-23 14:07:06)  

## 2015-11-20 ENCOUNTER — Other Ambulatory Visit: Payer: Self-pay | Admitting: *Deleted

## 2015-11-20 DIAGNOSIS — Z3493 Encounter for supervision of normal pregnancy, unspecified, third trimester: Secondary | ICD-10-CM

## 2015-11-20 DIAGNOSIS — Z3685 Encounter for antenatal screening for Streptococcus B: Secondary | ICD-10-CM

## 2015-11-20 DIAGNOSIS — Z113 Encounter for screening for infections with a predominantly sexual mode of transmission: Secondary | ICD-10-CM

## 2015-11-23 ENCOUNTER — Encounter: Payer: Medicaid Other | Admitting: Obstetrics and Gynecology

## 2015-11-24 ENCOUNTER — Encounter: Payer: Self-pay | Admitting: Obstetrics and Gynecology

## 2015-11-24 ENCOUNTER — Ambulatory Visit (INDEPENDENT_AMBULATORY_CARE_PROVIDER_SITE_OTHER): Payer: Medicaid Other | Admitting: Obstetrics and Gynecology

## 2015-11-24 ENCOUNTER — Other Ambulatory Visit: Payer: Self-pay | Admitting: Obstetrics and Gynecology

## 2015-11-24 VITALS — BP 114/78 | HR 80 | Wt 181.2 lb

## 2015-11-24 DIAGNOSIS — Z3493 Encounter for supervision of normal pregnancy, unspecified, third trimester: Secondary | ICD-10-CM

## 2015-11-24 NOTE — Patient Instructions (Signed)

## 2015-11-24 NOTE — Progress Notes (Signed)
ROB & cultures, having severe back pain, infant is OP- discussed positions to encourage rotation and use of maternity belts, cultures obtained.

## 2015-11-24 NOTE — Progress Notes (Signed)
ROB- pt c/o some contractions, pelvic pressure, low back pain, cultures obtained

## 2015-11-26 LAB — STREP GP B NAA: Strep Gp B NAA: NEGATIVE

## 2015-11-27 LAB — GC/CHLAMYDIA PROBE AMP
Chlamydia trachomatis, NAA: NEGATIVE
Neisseria gonorrhoeae by PCR: NEGATIVE

## 2015-11-29 ENCOUNTER — Ambulatory Visit (INDEPENDENT_AMBULATORY_CARE_PROVIDER_SITE_OTHER): Payer: Medicaid Other | Admitting: Obstetrics and Gynecology

## 2015-11-29 ENCOUNTER — Encounter: Payer: Self-pay | Admitting: Obstetrics and Gynecology

## 2015-11-29 VITALS — BP 122/76 | HR 81 | Wt 185.1 lb

## 2015-11-29 DIAGNOSIS — Z3493 Encounter for supervision of normal pregnancy, unspecified, third trimester: Secondary | ICD-10-CM

## 2015-11-29 LAB — POCT URINALYSIS DIPSTICK
Blood, UA: NEGATIVE
Glucose, UA: NEGATIVE
KETONES UA: NEGATIVE
LEUKOCYTES UA: NEGATIVE
Nitrite, UA: NEGATIVE
SPEC GRAV UA: 1.02
Urobilinogen, UA: 0.2
pH, UA: 6.5

## 2015-11-29 NOTE — Progress Notes (Signed)
ROB- doing better, baby now LOA, and reports less back pain, reviewed negative cultures.

## 2015-11-29 NOTE — Progress Notes (Signed)
ROB-pt is having some low back pain, some pelvic pressure 

## 2015-12-06 ENCOUNTER — Encounter: Payer: Self-pay | Admitting: Obstetrics and Gynecology

## 2015-12-06 ENCOUNTER — Ambulatory Visit (INDEPENDENT_AMBULATORY_CARE_PROVIDER_SITE_OTHER): Payer: Medicaid Other | Admitting: Obstetrics and Gynecology

## 2015-12-06 ENCOUNTER — Encounter: Payer: Self-pay | Admitting: *Deleted

## 2015-12-06 ENCOUNTER — Observation Stay
Admission: EM | Admit: 2015-12-06 | Discharge: 2015-12-07 | Disposition: A | Discharge status: Home / Self Care | Payer: Medicaid Other | Attending: Obstetrics and Gynecology | Admitting: Obstetrics and Gynecology

## 2015-12-06 VITALS — BP 120/78 | HR 85 | Wt 184.8 lb

## 2015-12-06 DIAGNOSIS — Z3493 Encounter for supervision of normal pregnancy, unspecified, third trimester: Secondary | ICD-10-CM

## 2015-12-06 DIAGNOSIS — O209 Hemorrhage in early pregnancy, unspecified: Principal | ICD-10-CM | POA: Insufficient documentation

## 2015-12-06 DIAGNOSIS — Z3A39 39 weeks gestation of pregnancy: Secondary | ICD-10-CM | POA: Insufficient documentation

## 2015-12-06 LAB — POCT URINALYSIS DIPSTICK
Bilirubin, UA: NEGATIVE
GLUCOSE UA: NEGATIVE
Ketones, UA: NEGATIVE
LEUKOCYTES UA: NEGATIVE
NITRITE UA: NEGATIVE
SPEC GRAV UA: 1.015
UROBILINOGEN UA: 0.2
pH, UA: 6

## 2015-12-06 NOTE — Progress Notes (Signed)
ROB- doing well, labor precautions reiterated, BOW palpated intact-reassured.

## 2015-12-06 NOTE — Progress Notes (Signed)
ROB- pt is having a lot of pelvic pressure, not sure if she is leaking any fluid

## 2015-12-07 DIAGNOSIS — Z3A39 39 weeks gestation of pregnancy: Secondary | ICD-10-CM | POA: Diagnosis not present

## 2015-12-07 DIAGNOSIS — O209 Hemorrhage in early pregnancy, unspecified: Secondary | ICD-10-CM | POA: Diagnosis present

## 2015-12-07 NOTE — OB Triage Note (Signed)
AVS discharge instructions and labor/pregnancy precautions given to patient with no questions or concerns.   Jerred Zaremba F  

## 2015-12-10 ENCOUNTER — Encounter: Payer: Self-pay | Admitting: *Deleted

## 2015-12-10 ENCOUNTER — Inpatient Hospital Stay
Admission: RE | Admit: 2015-12-10 | Discharge: 2015-12-12 | DRG: 775 | Disposition: A | Discharge status: Home / Self Care | Payer: Medicaid Other | Attending: Obstetrics and Gynecology | Admitting: Obstetrics and Gynecology

## 2015-12-10 DIAGNOSIS — Z3483 Encounter for supervision of other normal pregnancy, third trimester: Secondary | ICD-10-CM | POA: Diagnosis not present

## 2015-12-10 DIAGNOSIS — Z3A39 39 weeks gestation of pregnancy: Secondary | ICD-10-CM | POA: Diagnosis not present

## 2015-12-10 DIAGNOSIS — Z3490 Encounter for supervision of normal pregnancy, unspecified, unspecified trimester: Secondary | ICD-10-CM

## 2015-12-10 DIAGNOSIS — Z833 Family history of diabetes mellitus: Secondary | ICD-10-CM

## 2015-12-10 DIAGNOSIS — Z87891 Personal history of nicotine dependence: Secondary | ICD-10-CM

## 2015-12-10 LAB — CBC
HCT: 33.5 % — ABNORMAL LOW (ref 35.0–47.0)
Hemoglobin: 11.4 g/dL — ABNORMAL LOW (ref 12.0–16.0)
MCH: 29.1 pg (ref 26.0–34.0)
MCHC: 34 g/dL (ref 32.0–36.0)
MCV: 85.6 fL (ref 80.0–100.0)
PLATELETS: 195 10*3/uL (ref 150–440)
RBC: 3.91 MIL/uL (ref 3.80–5.20)
RDW: 14.4 % (ref 11.5–14.5)
WBC: 7.6 10*3/uL (ref 3.6–11.0)

## 2015-12-10 LAB — ABO/RH: ABO/RH(D): O POS

## 2015-12-10 LAB — TYPE AND SCREEN
ABO/RH(D): O POS
ANTIBODY SCREEN: NEGATIVE

## 2015-12-10 MED ORDER — MISOPROSTOL 200 MCG PO TABS
ORAL_TABLET | ORAL | Status: AC
  Filled 2015-12-10: qty 4

## 2015-12-10 MED ORDER — MISOPROSTOL 200 MCG PO TABS
ORAL_TABLET | ORAL | Status: AC
  Administered 2015-12-10: 200 ug
  Filled 2015-12-10: qty 4

## 2015-12-10 MED ORDER — ONDANSETRON HCL 4 MG/2ML IJ SOLN: 4.0000 mg | INTRAMUSCULAR | Status: DC | PRN

## 2015-12-10 MED ORDER — SIMETHICONE 80 MG PO CHEW: 80.0000 mg | CHEWABLE_TABLET | ORAL | Status: DC | PRN

## 2015-12-10 MED ORDER — ONDANSETRON HCL 4 MG/2ML IJ SOLN: 4.0000 mg | Freq: Four times a day (QID) | INTRAMUSCULAR | Status: DC | PRN

## 2015-12-10 MED ORDER — OXYTOCIN BOLUS FROM INFUSION: 500.0000 mL | INTRAVENOUS | Status: DC

## 2015-12-10 MED ORDER — DOCUSATE SODIUM 100 MG PO CAPS
100.0000 mg | ORAL_CAPSULE | Freq: Two times a day (BID) | ORAL | Status: DC
  Administered 2015-12-11 – 2015-12-12 (×3): 100 mg via ORAL
  Filled 2015-12-10 (×3): qty 1

## 2015-12-10 MED ORDER — PRENATAL MULTIVITAMIN CH
1.0000 | ORAL_TABLET | Freq: Every day | ORAL | Status: DC
  Administered 2015-12-11: 1 via ORAL
  Filled 2015-12-10: qty 1

## 2015-12-10 MED ORDER — OXYTOCIN 40 UNITS IN LACTATED RINGERS INFUSION - SIMPLE MED
INTRAVENOUS | Status: AC
  Filled 2015-12-10: qty 1000

## 2015-12-10 MED ORDER — OXYCODONE-ACETAMINOPHEN 5-325 MG PO TABS
1.0000 | ORAL_TABLET | ORAL | Status: DC | PRN
  Administered 2015-12-10: 1 via ORAL
  Filled 2015-12-10: qty 1

## 2015-12-10 MED ORDER — ONDANSETRON HCL 4 MG PO TABS: 4.0000 mg | ORAL_TABLET | ORAL | Status: DC | PRN

## 2015-12-10 MED ORDER — DIBUCAINE 1 % RE OINT: 1.0000 "application " | TOPICAL_OINTMENT | RECTAL | Status: DC | PRN

## 2015-12-10 MED ORDER — ZOLPIDEM TARTRATE 5 MG PO TABS: 5.0000 mg | ORAL_TABLET | Freq: Every evening | ORAL | Status: DC | PRN

## 2015-12-10 MED ORDER — FENTANYL CITRATE (PF) 100 MCG/2ML IJ SOLN: 50.0000 ug | INTRAMUSCULAR | Status: DC | PRN

## 2015-12-10 MED ORDER — AMMONIA AROMATIC IN INHA
RESPIRATORY_TRACT | Status: AC
  Filled 2015-12-10: qty 10

## 2015-12-10 MED ORDER — OXYTOCIN 10 UNIT/ML IJ SOLN
INTRAMUSCULAR | Status: AC
  Filled 2015-12-10: qty 2

## 2015-12-10 MED ORDER — LIDOCAINE HCL (PF) 1 % IJ SOLN
30.0000 mL | INTRAMUSCULAR | Status: DC | PRN
  Filled 2015-12-10: qty 30

## 2015-12-10 MED ORDER — LANOLIN HYDROUS EX OINT: TOPICAL_OINTMENT | CUTANEOUS | Status: DC | PRN

## 2015-12-10 MED ORDER — LIDOCAINE HCL (PF) 1 % IJ SOLN
INTRAMUSCULAR | Status: AC
  Filled 2015-12-10: qty 30

## 2015-12-10 MED ORDER — IBUPROFEN 600 MG PO TABS
600.0000 mg | ORAL_TABLET | Freq: Four times a day (QID) | ORAL | Status: DC
  Administered 2015-12-10 – 2015-12-12 (×6): 600 mg via ORAL
  Filled 2015-12-10 (×6): qty 1

## 2015-12-10 MED ORDER — LACTATED RINGERS IV SOLN
INTRAVENOUS | Status: DC
  Administered 2015-12-10 (×2): via INTRAVENOUS

## 2015-12-10 MED ORDER — DIPHENHYDRAMINE HCL 25 MG PO CAPS: 25.0000 mg | ORAL_CAPSULE | Freq: Four times a day (QID) | ORAL | Status: DC | PRN

## 2015-12-10 MED ORDER — WITCH HAZEL-GLYCERIN EX PADS: 1.0000 "application " | MEDICATED_PAD | CUTANEOUS | Status: DC | PRN

## 2015-12-10 MED ORDER — FE FUMARATE-B12-VIT C-FA-IFC PO CAPS: 1.0000 | ORAL_CAPSULE | Freq: Three times a day (TID) | ORAL | Status: DC

## 2015-12-10 MED ORDER — BENZOCAINE-MENTHOL 20-0.5 % EX AERO: 1.0000 "application " | INHALATION_SPRAY | CUTANEOUS | Status: DC | PRN

## 2015-12-10 MED ORDER — OXYTOCIN 40 UNITS IN LACTATED RINGERS INFUSION - SIMPLE MED
62.5000 mL/h | INTRAVENOUS | Status: DC
  Administered 2015-12-10: 3 mL/h via INTRAVENOUS
  Administered 2015-12-10: 15 mL/h via INTRAVENOUS

## 2015-12-10 MED ORDER — ACETAMINOPHEN 325 MG PO TABS: 650.0000 mg | ORAL_TABLET | ORAL | Status: DC | PRN

## 2015-12-10 MED ORDER — OXYCODONE-ACETAMINOPHEN 5-325 MG PO TABS: 2.0000 | ORAL_TABLET | ORAL | Status: DC | PRN

## 2015-12-10 MED ORDER — LACTATED RINGERS IV SOLN
500.0000 mL | INTRAVENOUS | Status: DC | PRN
Start: 2015-12-10 — End: 2015-12-10

## 2015-12-10 MED ORDER — LIDOCAINE HCL (PF) 1 % IJ SOLN
INTRAMUSCULAR | Status: AC
Start: 2015-12-10 — End: 2015-12-10
  Filled 2015-12-10: qty 30

## 2015-12-10 MED ORDER — AMMONIA AROMATIC IN INHA
RESPIRATORY_TRACT | Status: AC
Start: 2015-12-10 — End: 2015-12-10
  Filled 2015-12-10: qty 10

## 2015-12-10 MED ORDER — TERBUTALINE SULFATE 1 MG/ML IJ SOLN: 0.2500 mg | Freq: Once | INTRAMUSCULAR | Status: DC | PRN

## 2015-12-10 NOTE — Progress Notes (Signed)
Lisa Dominguez is a 19 y.o. G2P1001 at 8943w2d by LMP admitted for active labor, rupture of membranes  Subjective:   Objective: BP 121/77 mmHg  Pulse 79  Temp(Src) 98.2 F (36.8 C) (Oral)  Resp 18  Ht 5\' 1"  (1.549 m)  Wt 82.101 kg (181 lb)  BMI 34.22 kg/m2  LMP 03/10/2015 (LMP Unknown)      FHT:  FHR: 130 bpm, variability: moderate,  accelerations:  Present,  decelerations:  Absent UC:   regular, every 2 minutes on 4 mu/min pitocin, moderate to palpation SVE:   Dilation: 8 Effacement (%): 90 Station: 0 Exam by:: Areta HaberM. Burr Shambly, CNM  Labs: Lab Results  Component Value Date   WBC 7.6 12/10/2015   HGB 11.4* 12/10/2015   HCT 33.5* 12/10/2015   MCV 85.6 12/10/2015   PLT 195 12/10/2015    Assessment / Plan: Augmentation of labor, progressing well  Labor: Progressing normally Preeclampsia:  labs stable Fetal Wellbeing:  Category II Pain Control:  Labor support without medications I/D:  n/a Anticipated MOD:  NSVD  Freida Nebel N Marthann Abshier 12/10/2015, 2:04 PM

## 2015-12-10 NOTE — H&P (Signed)
Obstetric History and Physical  Lisa Dominguez is a 19 y.o. G2P1001 with IUP at [redacted]w[redacted]d presenting with leaking fluid and contractions. Patient states she has been having  irregular, every 5-7 minutes contractions, none vaginal bleeding, ruptured, clear fluid membranes, with active fetal movement.    Prenatal Course Source of Care: [facility]  Pregnancy complications or risks:none  Prenatal labs and studies: ABO, Rh: --/--/O POS, O POS (12/18 0408) Antibody: NEG (12/18 0408) Rubella: 13.00 (06/07 1543) RPR: Non Reactive (06/07 1543)  HBsAg: Negative (06/07 1543)  HIV:    WUJ:WJXBJYNW (12/02 0000) 1 hr Glucola  normal Genetic screening normal Anatomy US normal  Past Medical History  Diagnosis Date  . Mild pre-eclampsia   . Prolonged spontaneous rupture of membranes   . Pregnancy induced hypertension     1st pregnancy    Past Surgical History  Procedure Laterality Date  . No past surgeries      OB History  Gravida Para Term Preterm AB SAB TAB Ectopic Multiple Living  # Outcome Date GA Lbr Len/2nd Weight Sex Delivery Anes PTL Lv  2 Current           1 Term 12/17/13    F Vag-Spont   Y      Social History   Social History  . Marital Status: Single    Spouse Name: N/A  . Number of Children: N/A  . Years of Education: N/A   Social History Main Topics  . Smoking status: Former Games developer  . Smokeless tobacco: Never Used  . Alcohol Use: No  . Drug Use: No  . Sexual Activity: Yes    Birth Control/ Protection: None   Other Topics Concern  . None   Social History Narrative    Family History  Problem Relation Age of Onset  . Diabetes Maternal Grandfather   . Diabetes Paternal Grandfather     Prescriptions prior to admission  Medication Sig Dispense Refill Last Dose  . Iron-FA-B Cmp-C-Biot-Probiotic (FUSION PLUS) CAPS Take 1 capsule by mouth daily. 60 capsule 1 Taking  . Prenatal Vit-Fe Fumarate-FA (PRENATAL MULTIVITAMIN) TABS tablet Take 1 tablet  by mouth daily at 12 noon.   Taking    No Known Allergies  Review of Systems: Negative except for what is mentioned in HPI.  Physical Exam: BP 130/74 mmHg  Pulse 76  Temp(Src) 98.2 F (36.8 C) (Oral)  Resp 18  Ht  (1.549 m)  Wt 82.101 kg (181 lb)  BMI 34.22 kg/m2  LMP 03/10/2015 (LMP Unknown) GENERAL: Well-developed, well-nourished female in no acute distress.  LUNGS: Clear to auscultation bilaterally.  HEART: Regular rate and rhythm. ABDOMEN: Soft, nontender, nondistended, gravid. EXTREMITIES: Nontender, no edema, 2+ distal pulses. Cervical Exam: Dilation: 4 Effacement (%): 60 Cervical Position: Middle Station: -2 Presentation: Vertex Exam by:: RDP FHT:  Baseline rate 130 bpm   Variability moderate  Accelerations present   Decelerations none Contractions: Every 6 mins on 2 mu/min of pitocin   Pertinent Labs/Studies:   Results for orders placed or performed during the hospital encounter of 12/10/15 (from the past 24 hour(s))  CBC     Status: Abnormal   Collection Time: 12/10/15  4:08 AM  Result Value Ref Range   WBC 7.6 3.6 - 11.0 K/uL   RBC 3.91 3.80 - 5.20 MIL/uL   Hemoglobin 11.4 (L) 12.0 - 16.0 g/dL   HCT 29.5 (L) 62.1 - 30.8 %  MCV 85.6 80.0 - 100.0 fL   MCH 29.1 26.0 - 34.0 pg   MCHC 34.0 32.0 - 36.0 g/dL   RDW 09.614.4 04.511.5 - 40.914.5 %   Platelets 195 150 - 440 K/uL  Type and screen [facility]     Status: None   Collection Time: 12/10/15  4:08 AM  Result Value Ref Range   ABO/RH(D) O POS    Antibody Screen NEG    Sample Expiration 12/13/2015   ABO/Rh     Status: None   Collection Time: 12/10/15  4:08 AM  Result Value Ref Range   ABO/RH(D) [place] POS     Assessment : Lisa Dominguez is a 19 y.o. G2P1001 at 923w2d being admitted for labor.  Plan: Labor: Expectant management.  Induction/Augmentation as needed, per protocol FWB: Reassuring fetal heart tracing.  GBS negative Delivery plan: Hopeful for vaginal delivery  Darcey Demma CNM [facility], [facility]

## 2015-12-11 LAB — CBC
HEMATOCRIT: 30.8 % — AB (ref 35.0–47.0)
HEMOGLOBIN: 10.3 g/dL — AB (ref 12.0–16.0)
MCH: 28.8 pg (ref 26.0–34.0)
MCHC: 33.4 g/dL (ref 32.0–36.0)
MCV: 86 fL (ref 80.0–100.0)
Platelets: 187 10*3/uL (ref 150–440)
RBC: 3.58 MIL/uL — ABNORMAL LOW (ref 3.80–5.20)
RDW: 14.8 % — ABNORMAL HIGH (ref 11.5–14.5)
WBC: 7.8 10*3/uL (ref 3.6–11.0)

## 2015-12-11 LAB — RPR: RPR: NONREACTIVE

## 2015-12-11 LAB — RAPID HIV SCREEN (HIV 1/2 AB+AG)
HIV 1/2 ANTIBODIES: NONREACTIVE
HIV-1 P24 Antigen - HIV24: NONREACTIVE

## 2015-12-11 NOTE — Progress Notes (Signed)
Post Partum Day one Subjective: no complaints, up ad lib, voiding, tolerating PO, + flatus and Patient is doing well and desires discharge today if pediatrician discharge the baby.  She is bottle feeding.  She is unsure what she wants for birth control.  Objective: Blood pressure 112/64, pulse 75, temperature 98.2 F (36.8 C), temperature source Oral, resp. rate 18, height 5\' 1"  (1.549 m), weight 181 lb (82.101 kg), last menstrual period 03/10/2015, SpO2 98 %, unknown if currently breastfeeding.  Physical Exam:  General: alert, cooperative and appears stated age Lochia: appropriate Uterine Fundus: firm Incision: none DVT Evaluation: No evidence of DVT seen on physical exam. Negative Homan's sign.   Recent Labs  12/10/15 0408 12/11/15 0551  HGB 11.4* 10.3*  HCT 33.5* 30.8*    Assessment/Plan: Discharge home if pediatrician discharges baby.   LOS: 1 day   Lisa Dominguez 12/11/2015, 8:13 AM

## 2015-12-11 NOTE — Discharge Summary (Addendum)
Physician Obstetric Discharge Summary  Patient ID: Lisa Dominguez MRN: [id number] DOB/AGE: 20/05/1996 20 y.o.   Date of Admission: 12/10/2015  Date of Discharge:   Admitting Diagnosis: Onset of Labor at 4014w2d  Secondary Diagnosis: Term vaginal birth  Mode of Delivery: normal spontaneous vaginal delivery     Discharge Diagnosis: Status post vaginal birth; postpartum day 2   Intrapartum Procedures: none   Post partum procedures: none  Complications: none and uterine atony with 400 cc of blood loss   Brief Hospital Course  Lisa Dominguez is a [id number] who had a SVD on 12/10/15;  for further details of this surgery, please refer to the delivey note.  Patient had an uncomplicated postpartum course.  By time of discharge on PPD#1, her pain was controlled on oral pain medications; she had appropriate lochia and was ambulating, voiding without difficulty and tolerating regular diet.  She was deemed stable for discharge to home.    Labs: CBC Latest Ref Rng 12/11/2015 12/10/2015 09/27/2015  WBC 3.6 - 11.0 K/uL 7.8 7.6 -  Hemoglobin 12.0 - 16.0 g/dL 10.3(L) 11.4(L) -  Hematocrit 35.0 - 47.0 % 30.8(L) 33.5(L) 31.4(L)  Platelets 150 - 440 K/uL 187 195 -   O POS  Physical exam:  Blood pressure 98/59, pulse 85, temperature 98.5 F (36.9 C), temperature source Oral, resp. rate 20, height 5\' 1"  (1.549 m), weight 181 lb (82.101 kg), last menstrual period 03/10/2015, SpO2 98 %, unknown if currently breastfeeding. General: alert and no distress Lochia: appropriate Abdomen: soft, NT Uterine Fundus: firm Extremities: No evidence of DVT seen on physical exam. No lower extremity edema.  Discharge Instructions: Per After Visit Summary. Activity: Advance as tolerated. Pelvic rest for 6 weeks.  Also refer to After Visit Summary Diet: Regular Medications:   Medication List    ASK your doctor about these medications        FUSION PLUS Caps  Take 1 capsule by mouth daily.     prenatal  multivitamin Tabs tablet  Take 1 tablet by mouth daily at 12 noon.       Outpatient follow up:  Postpartum contraception: nextplanon  Discharged Condition: good  Discharged to: home   Newborn Data: Disposition:home with mother  Apgars: APGAR (1 MIN): 8   APGAR (5 MINS): 9   APGAR (10 MINS):    Baby Feeding: Bottle  Ardelia MemsSharon Varner, CNM   I have reviewed the record and concur with patient management and plan. Ranger Petrich, Daphine DeutscherMARTIN, MD, Evern CoreFACOG

## 2015-12-12 ENCOUNTER — Encounter: Payer: Self-pay | Admitting: Certified Nurse Midwife

## 2015-12-12 LAB — HIV ANTIBODY (ROUTINE TESTING W REFLEX): HIV Screen 4th Generation wRfx: NONREACTIVE

## 2015-12-12 MED ORDER — IBUPROFEN 600 MG PO TABS: 600.0000 mg | ORAL_TABLET | Freq: Four times a day (QID) | ORAL | Status: DC

## 2015-12-12 NOTE — Final Progress Note (Signed)
Discharge instructions complete. Patient verbalizes understanding of teaching. Patient discharged home at 1050.

## 2015-12-12 NOTE — Discharge Summary (Signed)
Obstetric Discharge Summary Reason for Admission: onset of labor Prenatal Procedures: none Intrapartum Procedures: none Postpartum Procedures: none Complications-Operative and Postpartum: none HEMOGLOBIN  Date Value Ref Range Status  12/11/2015 10.3* 12.0 - 16.0 g/dL Final   HGB  Date Value Ref Range Status  12/20/2013 6.5* 12.0-16.0 g/dL Final   HCT  Date Value Ref Range Status  12/11/2015 30.8* 35.0 - 47.0 % Final  12/20/2013 19.0* 35.0-47.0 % Final   HEMATOCRIT  Date Value Ref Range Status  09/27/2015 31.4* 34.0 - 46.6 % Final    Physical Exam:  General: alert, cooperative and appears stated age Lochia: appropriate Uterine Fundus: firm Incision: none present DVT Evaluation: No evidence of DVT seen on physical exam. Negative Homan's sign.  Discharge Diagnoses: Term Pregnancy-delivered  Discharge Information: Date: 12/12/2015 Activity: pelvic rest Diet: routine and regular Medications: PNV and Ibuprofen Condition: stable Instructions: refer to practice specific booklet Discharge to: home Follow-up Information    Follow up with Melody Suzan NailerN Shambley, CNM. Schedule an appointment as soon as possible for a visit in 6 weeks.   Specialties:  Obstetrics and Gynecology, Radiology   Why:  For postpartum follow up   Contact information:   [address] [place] [address] [phone]       Newborn Data: Live born female  Birth Weight: 6 lb 8.4 oz (2960 g) APGAR: 8, 9  Home with mother.  Ardelia MemsSharon Varner 12/12/2015, 9:46 AM    I have reviewed the record and concur with patient management and plan. Sherin Murdoch, Daphine DeutscherMARTIN, MD, Evern CoreFACOG

## 2015-12-12 NOTE — Progress Notes (Signed)
Post Partum Day two Subjective: no complaints, up ad lib, voiding and tolerating PO  Patient is bottle feeding.  Desires Nextplanon for birth control.  Objective: Blood pressure 122/69, pulse 99, temperature 98.4 F (36.9 C), temperature source Oral, resp. rate 18, height 5\' 1"  (1.549 m), weight 181 lb (82.101 kg), last menstrual period 03/10/2015, SpO2 100 %, unknown if currently breastfeeding.  Physical Exam:  General: alert and cooperative Lochia: appropriate Uterine Fundus: firm Incision: none present DVT Evaluation: No evidence of DVT seen on physical exam. Negative Homan's sign.   Recent Labs  12/10/15 0408 12/11/15 0551  HGB 11.4* 10.3*  HCT 33.5* 30.8*    Assessment/Plan: Discharge home   LOS: 2 days   Ardelia MemsSharon Mont Jagoda Certified Nurse Midwife 12/12/2015, 7:52 AM

## 2015-12-13 ENCOUNTER — Encounter: Payer: Medicaid Other | Admitting: Obstetrics and Gynecology

## 2016-01-24 ENCOUNTER — Ambulatory Visit (INDEPENDENT_AMBULATORY_CARE_PROVIDER_SITE_OTHER): Payer: Medicaid Other | Admitting: Obstetrics and Gynecology

## 2016-01-24 ENCOUNTER — Encounter: Payer: Self-pay | Admitting: Obstetrics and Gynecology

## 2016-01-24 NOTE — Patient Instructions (Signed)
  Place postpartum visit patient instructions here.  

## 2016-01-24 NOTE — Progress Notes (Signed)
  Subjective:     Lisa Dominguez is a 20 y.o. female who presents for a postpartum visit. She is 6 weeks postpartum following a spontaneous vaginal delivery. I have fully reviewed the prenatal and intrapartum course. The delivery was at 39.2 gestational weeks. Outcome: spontaneous vaginal delivery. Anesthesia: none. Postpartum course has been uncomplicated. Baby's course has been uncomplicated. Baby is feeding by formula x 2 weeks. Bleeding no bleeding. Bowel function is normal. Bladder function is normal. Patient is sexually active. Contraception method is coitus interruptus. Postpartum depression screening: negative.  The following portions of the patient's history were reviewed and updated as appropriate: allergies, current medications, past family history, past medical history, past social history, past surgical history and problem list.  Review of Systems A comprehensive review of systems was negative.   Objective:    BP 116/75 mmHg  Pulse 75  Ht  (1.549 m)  Wt 163 lb 8 oz (74.163 kg)  BMI 30.91 kg/m2  Breastfeeding? No  General:  alert, cooperative and appears stated age   Breasts:  inspection negative, no nipple discharge or bleeding, no masses or nodularity palpable  Lungs: clear to auscultation bilaterally  Heart:  regular rate and rhythm, S1, S2 normal, no murmur, click, rub or gallop  Abdomen: soft, non-tender; bowel sounds normal; no masses,  no organomegaly mild diastisi recti   Vulva:  normal  Vagina: normal vagina  Cervix:  multiparous appearance  Corpus: normal size, contour, position, consistency, mobility, non-tender  Adnexa:  normal adnexa and no mass, fullness, tenderness  Rectal Exam: Not performed.        Assessment:     6 weeks postpartum exam. Pap smear not done at today's visit.   Plan:    1. Contraception: IUD will insert with next menses 2. Labs drawn to recheck anemia 3. Follow up in: 2 weeks or as needed.

## 2016-01-25 ENCOUNTER — Other Ambulatory Visit: Payer: Self-pay | Admitting: Obstetrics and Gynecology

## 2016-01-25 DIAGNOSIS — E559 Vitamin D deficiency, unspecified: Secondary | ICD-10-CM | POA: Insufficient documentation

## 2016-01-25 LAB — CBC
Hematocrit: 34.3 % (ref 34.0–46.6)
Hemoglobin: 12 g/dL (ref 11.1–15.9)
MCH: 28.8 pg (ref 26.6–33.0)
MCHC: 35 g/dL (ref 31.5–35.7)
MCV: 82 fL (ref 79–97)
PLATELETS: 223 10*3/uL (ref 150–379)
RBC: 4.17 x10E6/uL (ref 3.77–5.28)
RDW: 13.8 % (ref 12.3–15.4)
WBC: 5.6 10*3/uL (ref 3.4–10.8)

## 2016-01-25 LAB — IRON: IRON: 86 ug/dL (ref 27–159)

## 2016-01-25 LAB — VITAMIN D 25 HYDROXY (VIT D DEFICIENCY, FRACTURES): VIT D 25 HYDROXY: 5.7 ng/mL — AB (ref 30.0–100.0)

## 2016-01-25 MED ORDER — VITAMIN D (ERGOCALCIFEROL) 1.25 MG (50000 UNIT) PO CAPS: 50000.0000 [IU] | ORAL_CAPSULE | ORAL | Status: DC

## 2016-01-26 ENCOUNTER — Telehealth: Payer: Self-pay | Admitting: *Deleted

## 2016-01-26 NOTE — Telephone Encounter (Signed)
Notified pt of labs and mailed all info to her

## 2016-01-26 NOTE — Telephone Encounter (Signed)
-----   Message from Pur[place] N[place], [place] sent at 01/25/2016  6:23 PM EST ----- Please let her know she is not longer anemic, but vit D is very low! I sent in rx for 2 x week supplement, and want to recheck her levels in 3 months. Please mail info on vit D def.

## 2016-02-09 ENCOUNTER — Ambulatory Visit: Payer: Medicaid Other | Admitting: Obstetrics and Gynecology

## 2016-02-16 ENCOUNTER — Ambulatory Visit (INDEPENDENT_AMBULATORY_CARE_PROVIDER_SITE_OTHER): Payer: Medicaid Other | Admitting: Obstetrics and Gynecology

## 2016-02-16 ENCOUNTER — Encounter: Payer: Self-pay | Admitting: Obstetrics and Gynecology

## 2016-02-16 VITALS — BP 128/78 | HR 88 | Ht 61.0 in | Wt 164.2 lb

## 2016-02-16 DIAGNOSIS — Z30430 Encounter for insertion of intrauterine contraceptive device: Secondary | ICD-10-CM | POA: Diagnosis not present

## 2016-02-16 MED ORDER — LEVONORGESTREL 20 MCG/24HR IU IUD: 1.0000 | INTRAUTERINE_SYSTEM | Freq: Once | INTRAUTERINE | Status: DC

## 2016-02-16 NOTE — Progress Notes (Signed)
Patient ID: Lisa Dominguez, female   DOB: [birth year], 20 y.o.   MRN: [id number] Lisa Dominguez is a 20 y.o. year old G49P2001 Hispanic female who presents for placement of a Mirena IUD.  Patient's last menstrual period was 02/16/2016 (exact date). BP 128/78 mmHg  Pulse 88  Ht  (1.549 m)  Wt 164 lb 3.2 oz (74.481 kg)  BMI 31.04 kg/m2  LMP 02/16/2016 (Exact Date) Last sexual intercourse was 1 week ago, and pregnancy test today was NA as she is on menses  The risks and benefits of the method and placement have been thouroughly reviewed with the patient and all questions were answered.  Specifically the patient is aware of failure rate of 12/998, expulsion of the IUD and of possible perforation.  The patient is aware of irregular bleeding due to the method and understands the incidence of irregular bleeding diminishes with time.  Signed copy of informed consent in chart.   Time out was performed.  A graves speculum was placed in the vagina.  The cervix was visualized, prepped using Betadine, and grasped with a single tooth tenaculum. The uterus was found to be neutral and it sounded to 8 cm.  Mirena IUD placed per manufacturer's recommendations.   The strings were trimmed to 3 cm.  The patient was given post procedure instructions, including signs and symptoms of infection and to check for the strings after each menses or each month, and refraining from intercourse or anything in the vagina for 3 days.  She was given a Mirena care card with date Mirena placed, and date Mirena to be removed.    Shayleigh Bouldin Suzan Nailer, CNM

## 2016-06-18 ENCOUNTER — Encounter: Payer: Medicaid Other | Admitting: Obstetrics and Gynecology

## 2019-05-26 ENCOUNTER — Ambulatory Visit: Payer: Self-pay | Admitting: *Deleted

## 2019-05-26 NOTE — Telephone Encounter (Signed)
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, and wears gloves and mask; she also practices social distancing; she says that of cough, dry throat (whichi is common for her), headache, loss of smell and taste, and chest tightness when taking a deep breath; her symptoms started on 05/23/2019; the pt went to [facility] on 05/25/2019; she says that her family moved into a new home, and she thought that the [facility] caused her symptoms; recommendations made per nurse triage protocol; she verbalizes understanding; pt declined information related to free e-visit, and says that she will go to urgent care if she does not feel better  Reason for Disposition . COVID-19 Disease, questions about  Answer Assessment - Initial Assessment Questions 1. COVID-19 DIAGNOSIS: "Who made your Coronavirus (COVID-19) diagnosis?" "Was it confirmed by a positive lab test?" If not diagnosed by a HCP, ask "Are there lots of cases (community spread) where you live?" (See public health department website, if unsure)     n/a 2. ONSET: "When did the COVID-19 symptoms start?"      05/23/2019 3. WORST SYMPTOM: "What is your worst symptom?" (e.g., cough, fever, shortness of breath, muscle aches)     Dry throat 4. COUGH: "Do you have a cough?" If so, ask: "How bad is the cough?"       mild 5. FEVER: "Do you have a fever?" If so, ask: "What is your temperature, how was it measured, and when did it start?"     no 6. RESPIRATORY STATUS: "Describe your breathing?" (e.g., shortness of breath, wheezing, unable to speak)     Chest tightness 7. BETTER-SAME-WORSE: "Are you getting better, staying the same or getting worse compared to yesterday?"  If getting worse, ask, "In what way?"     better 8. HIGH RISK DISEASE: "Do you have any chronic medical problems?" (e.g., asthma, heart or lung disease, weak immune system, etc.)     no 9. PREGNANCY: "Is there any chance you are pregnant?" "When was your last menstrual  period?"     10. OTHER SYMPTOMS: "Do you have any other symptoms?"  (e.g., chills, fatigue, headache, loss of smell or taste, muscle pain, sore throat)       Headache, loss of taste/smell  Protocols used: CORONAVIRUS (COVID-19) DIAGNOSED OR SUSPECTED-A-AH

## 2021-04-15 ENCOUNTER — Ambulatory Visit (HOSPITAL_COMMUNITY)
Admission: EM | Admit: 2021-04-15 | Discharge: 2021-04-15 | Disposition: A | Discharge status: Home / Self Care | Payer: Medicaid Other | Attending: Student | Admitting: Student

## 2021-04-15 ENCOUNTER — Other Ambulatory Visit: Payer: Self-pay

## 2021-04-15 ENCOUNTER — Encounter (HOSPITAL_COMMUNITY): Payer: Self-pay | Admitting: Emergency Medicine

## 2021-04-15 DIAGNOSIS — J029 Acute pharyngitis, unspecified: Secondary | ICD-10-CM | POA: Insufficient documentation

## 2021-04-15 DIAGNOSIS — Z112 Encounter for screening for other bacterial diseases: Secondary | ICD-10-CM

## 2021-04-15 DIAGNOSIS — K120 Recurrent oral aphthae: Secondary | ICD-10-CM | POA: Diagnosis not present

## 2021-04-15 LAB — POCT RAPID STREP A, ED / UC: Streptococcus, Group A Screen (Direct): NEGATIVE

## 2021-04-15 MED ORDER — LIDOCAINE VISCOUS HCL 2 % MT SOLN: 15.0000 mL | OROMUCOSAL | 0 refills | Status: DC | PRN

## 2021-04-15 MED ORDER — PREDNISONE 20 MG PO TABS: 40.0000 mg | ORAL_TABLET | Freq: Every day | ORAL | 0 refills | Status: AC

## 2021-04-15 NOTE — ED Triage Notes (Signed)
Pt presents today with c/o of sorethroat and sores on lip x 2 days.

## 2021-04-15 NOTE — Discharge Instructions (Addendum)
-  Prednisone, 2 pills taken at the same time for 5 days in a row.  Try taking this earlier in the day as it can give you energy.  -For sore throat, use lidocaine mouthwash up to every 4 hours. Make sure not to eat for at least 1 hour after using this, as your mouth will be very numb and you could bite yourself. You can also apply this to your lips to provide some relief  -The aphthous ulcers on your lips will heal on their own in the next week or so. However, you can use vaseline or similar to moisturize them  to help healing.

## 2021-04-15 NOTE — ED Provider Notes (Signed)
[facility]    CSN: [id number] Arrival date & time: 04/15/21  1604      History   Chief Complaint Chief Complaint  Patient presents with  . Sore Throat    HPI Lisa Dominguez is a 25 y.o. female presenting with sore throat and sores on lips x2 days.  Notes subjective chills, cervical lymphadenopathy, sore throat for 3 days.  Took some cold medication 3 days ago with some relief, but has not taken it since then. Denies chills, n/v/d, shortness of breath, chest pain, cough, congestion, facial pain, teeth pain, headaches,  loss of taste/smell, ear pain.    HPI  Past Medical History:  Diagnosis Date  . Mild pre-eclampsia   . Pregnancy induced hypertension    1st pregnancy  . Prolonged spontaneous rupture of membranes     Patient Active Problem List   Diagnosis Date Noted  . Vitamin D deficiency 01/25/2016  . Vaginal delivery 12/10/2015  . History of pregnancy induced hypertension 06/09/2015    Past Surgical History:  Procedure Laterality Date  . NO PAST SURGERIES      OB History    Gravida  2   Para  2   Term  2   Preterm      AB      Living  1     SAB      IAB      Ectopic      Multiple  0   Live Births  1            Home Medications    Prior to Admission medications   Medication Sig Start Date End Date Taking? Authorizing Provider  levonorgestrel (MIRENA) 20 MCG/24HR IUD 1 Intra Uterine Device (1 each total) by Intrauterine route once. 02/16/16  Yes Shambley, Melody N, CNM  lidocaine (XYLOCAINE) 2 % solution Use as directed 15 mLs in the mouth or throat as needed for mouth pain. 04/15/21  Yes Rhys Martini, PA-C  predniSONE (DELTASONE) 20 MG tablet Take 2 tablets (40 mg total) by mouth daily for 5 days. 04/15/21 04/20/21 Yes Rhys Martini, PA-C    Family History Family History  Problem Relation Age of Onset  . Diabetes Maternal Grandfather   . Diabetes Paternal Grandfather     Social History Social History    Tobacco Use  . Smoking status: Former Games developer  . Smokeless tobacco: Never Used  Vaping Use  . Vaping Use: Former  . Quit date: 04/13/2021  Substance Use Topics  . Alcohol use: No  . Drug use: No     Allergies   Patient has no known allergies.   Review of Systems Review of Systems  Constitutional: Negative for appetite change, chills and fever.  HENT: Positive for sore throat. Negative for congestion, ear pain, rhinorrhea, sinus pressure, sinus pain, trouble swallowing and voice change.   Eyes: Negative for redness and visual disturbance.  Respiratory: Negative for cough, chest tightness, shortness of breath and wheezing.   Cardiovascular: Negative for chest pain and palpitations.  Gastrointestinal: Negative for abdominal pain, constipation, diarrhea, nausea and vomiting.  Genitourinary: Negative for dysuria, frequency and urgency.  Musculoskeletal: Negative for myalgias.  Neurological: Negative for dizziness, weakness and headaches.  Psychiatric/Behavioral: Negative for confusion.  All other systems reviewed and are negative.    Physical Exam Triage Vital Signs ED Triage Vitals  Enc Vitals Group     BP 04/15/21 1653 124/75     Pulse Rate 04/15/21 1653 (!) 107  Resp 04/15/21 1653 16     Temp 04/15/21 1653 99.9 F (37.7 C)     Temp Source 04/15/21 1653 Oral     SpO2 04/15/21 1653 98 %     Weight --      Height --      Head Circumference --      Peak Flow --      Pain Score 04/15/21 1650 7     Pain Loc --      Pain Edu? --      Excl. in GC? --    No data found.  Updated Vital Signs BP 124/75 (BP Location: Right Arm)   Pulse (!) 107   Temp 99.9 F (37.7 C) (Oral)   Resp 16   LMP 03/21/2021 (Approximate)   SpO2 98%   Visual Acuity Right Eye Distance:   Left Eye Distance:   Bilateral Distance:    Right Eye Near:   Left Eye Near:    Bilateral Near:     Physical Exam Vitals reviewed.  Constitutional:      General: She is not in acute distress.     Appearance: Normal appearance. She is not ill-appearing.  HENT:     Head: Normocephalic and atraumatic.     Right Ear: Hearing, tympanic membrane, ear canal and external ear normal. No swelling or tenderness. There is no impacted cerumen. No mastoid tenderness. Tympanic membrane is not perforated, erythematous, retracted or bulging.     Left Ear: Hearing, tympanic membrane, ear canal and external ear normal. No swelling or tenderness. There is no impacted cerumen. No mastoid tenderness. Tympanic membrane is not perforated, erythematous, retracted or bulging.     Nose:     Right Sinus: No maxillary sinus tenderness or frontal sinus tenderness.     Left Sinus: No maxillary sinus tenderness or frontal sinus tenderness.     Mouth/Throat:     Mouth: Mucous membranes are moist.     Pharynx: Uvula midline. Posterior oropharyngeal erythema present. No oropharyngeal exudate.     Tonsils: No tonsillar exudate. 1+ on the left.     Comments: Smooth erythema posterior pharynx. No exudate. On exam she is tolerating her secretions without difficulty, there is no trismus, no drooling, she has normal phonation  Cardiovascular:     Rate and Rhythm: Normal rate and regular rhythm.     Heart sounds: Normal heart sounds.  Pulmonary:     Breath sounds: Normal breath sounds and air entry. No wheezing, rhonchi or rales.  Chest:     Chest wall: No tenderness.  Abdominal:     General: Abdomen is flat. Bowel sounds are normal.     Tenderness: There is no abdominal tenderness. There is no guarding or rebound.  Lymphadenopathy:     Cervical: Cervical adenopathy present.     Right cervical: Superficial cervical adenopathy present.     Left cervical: Superficial cervical adenopathy present.  Skin:    Capillary Refill: Capillary refill takes less than 2 seconds.  Neurological:     General: No focal deficit present.     Mental Status: She is alert and oriented to person, place, and time.  Psychiatric:         Attention and Perception: Attention and perception normal.        Mood and Affect: Mood and affect normal.        Behavior: Behavior normal. Behavior is cooperative.        Thought Content: Thought content normal.  Judgment: Judgment normal.      UC Treatments / Results  Labs (all labs ordered are listed, but only abnormal results are displayed) Labs Reviewed  CULTURE, GROUP A STREP [facility])  POCT RAPID STREP A, ED / UC    EKG   Radiology No results found.  Procedures Procedures (including critical care time)  Medications Ordered in UC Medications - No data to display  Initial Impression / Assessment and Plan / UC Course  I have reviewed the triage vital signs and the nursing notes.  Pertinent labs & imaging results that were available during my care of the patient were reviewed by me and considered in my medical decision making (see chart for details).     This patient is a 25 year old female presenting with viral pharyngitis.  Borderline febrile and tachycardic, has not taken antipyretic today. On exam she is tolerating her secretions without difficulty, there is no trismus, no drooling, she has normal phonation  Rapid strep negative, culture sent.  Declines COVID PCR.  Viscous lidocaine and prednisone as below.  ED return precautions discussed.  Work note provided.  Final Clinical Impressions(s) / UC Diagnoses   Final diagnoses:  Aphthous ulcer  Acute viral pharyngitis  Screening for streptococcal infection     Discharge Instructions     -Prednisone, 2 pills taken at the same time for 5 days in a row.  Try taking this earlier in the day as it can give you energy.  -For sore throat, use lidocaine mouthwash up to every 4 hours. Make sure not to eat for at least 1 hour after using this, as your mouth will be very numb and you could bite yourself. You can also apply this to your lips to provide some relief  -The aphthous ulcers on your lips will heal on  their own in the next week or so. However, you can use vaseline or similar to moisturize them  to help healing.    ED Prescriptions    Medication Sig Dispense Auth. Provider   lidocaine (XYLOCAINE) 2 % solution Use as directed 15 mLs in the mouth or throat as needed for mouth pain. 100 mL Rhys Martini, PA-C   predniSONE (DELTASONE) 20 MG tablet Take 2 tablets (40 mg total) by mouth daily for 5 days. 10 tablet Rhys Martini, PA-C     PDMP not reviewed this encounter.   Rhys Martini, PA-C 04/15/21 1815

## 2021-04-16 LAB — CULTURE, GROUP A STREP (THRC)

## 2021-04-18 LAB — CULTURE, GROUP A STREP (THRC)

## 2021-04-19 ENCOUNTER — Encounter: Payer: Self-pay | Admitting: Physician Assistant

## 2021-04-19 ENCOUNTER — Emergency Department
Admission: EM | Admit: 2021-04-19 | Discharge: 2021-04-19 | Disposition: A | Discharge status: Home / Self Care | Payer: Medicaid Other | Attending: Emergency Medicine | Admitting: Emergency Medicine

## 2021-04-19 ENCOUNTER — Other Ambulatory Visit: Payer: Self-pay

## 2021-04-19 DIAGNOSIS — K9281 Gastrointestinal mucositis (ulcerative): Secondary | ICD-10-CM | POA: Insufficient documentation

## 2021-04-19 DIAGNOSIS — K130 Diseases of lips: Secondary | ICD-10-CM | POA: Diagnosis present

## 2021-04-19 DIAGNOSIS — Z87891 Personal history of nicotine dependence: Secondary | ICD-10-CM | POA: Diagnosis not present

## 2021-04-19 DIAGNOSIS — K1230 Oral mucositis (ulcerative), unspecified: Secondary | ICD-10-CM

## 2021-04-19 DIAGNOSIS — K121 Other forms of stomatitis: Secondary | ICD-10-CM | POA: Diagnosis not present

## 2021-04-19 MED ORDER — LIDOCAINE VISCOUS HCL 2 % MT SOLN
15.0000 mL | Freq: Once | OROMUCOSAL | Status: DC
  Filled 2021-04-19: qty 15

## 2021-04-19 MED ORDER — ACYCLOVIR 400 MG PO TABS: 400.0000 mg | ORAL_TABLET | Freq: Three times a day (TID) | ORAL | 0 refills | Status: AC

## 2021-04-19 MED ORDER — NYSTATIN 100000 UNIT/ML MT SUSP: 5.0000 mL | Freq: Four times a day (QID) | OROMUCOSAL | 0 refills | Status: DC | PRN

## 2021-04-19 MED ORDER — ACYCLOVIR 200 MG PO CAPS
400.0000 mg | ORAL_CAPSULE | Freq: Once | ORAL | Status: AC
Start: 2021-04-19 — End: 2021-04-19
  Administered 2021-04-19: 400 mg via ORAL
  Filled 2021-04-19: qty 2

## 2021-04-19 NOTE — Discharge Instructions (Signed)
Use the Magic Mouthwash as directed. Take the anti-viral tabs as directed. Follow-up with your provider or [facility], if needed.

## 2021-04-19 NOTE — ED Notes (Signed)
Pt has been provided with discharge instructions. Pt denies any questions or concerns at this time. Pt verbalizes understanding for follow up care and d/c.  VSS.  Pt left department with all belongings.  

## 2021-04-19 NOTE — ED Provider Notes (Signed)
Lisa Dominguez ____________________________________________  Time seen: 2024  I have reviewed the triage vital signs and the nursing notes.  HISTORY  Chief Complaint  Rash  HPI Lisa Dominguez is a 25 y.o. female presents herself to the ED for evaluation of ongoing lip ulceration and oral lesions.  Patient describes being evaluated by local urgent care a few days prior with a negative strep test.  She was started on a viscous lidocaine solution which has been minimally helpful.   She describes a viral prodrome in the days before she presented to the ED.  Presents now with source of the tongue and feeling to the lip.  She also has noted some mild low-grade fevers.  She denies any cough, congestion, chest pain, shortness of breath.  Past Medical History:  Diagnosis Date  . Mild pre-eclampsia   . Pregnancy induced hypertension    1st pregnancy  . Prolonged spontaneous rupture of membranes     Patient Active Problem List   Diagnosis Date Noted  . Vitamin D deficiency 01/25/2016  . Vaginal delivery 12/10/2015  . History of pregnancy induced hypertension 06/09/2015    Past Surgical History:  Procedure Laterality Date  . NO PAST SURGERIES      Prior to Admission medications   Medication Sig Start Date End Date Taking? Authorizing Provider  acyclovir (ZOVIRAX) 400 MG tablet Take 1 tablet (400 mg total) by mouth 3 (three) times daily for 10 days. 04/19/21 04/29/21 Yes Ramez Arrona, Charlesetta Ivory, PA-C  magic mouthwash (nystatin, lidocaine, diphenhydrAMINE, alum & mag hydroxide) suspension Swish and spit 5 mLs 4 (four) times daily as needed for mouth pain. 04/19/21  Yes Kiaraliz Rafuse, Charlesetta Ivory, PA-C  levonorgestrel (MIRENA) 20 MCG/24HR IUD 1 Intra Uterine Device (1 each total) by Intrauterine route once. 02/16/16   Shambley, Melody N, CNM  lidocaine (XYLOCAINE) 2 % solution Use as directed 15 mLs in the mouth or throat as needed for mouth  pain. 04/15/21   Rhys Martini, PA-C  predniSONE (DELTASONE) 20 MG tablet Take 2 tablets (40 mg total) by mouth daily for 5 days. 04/15/21 04/20/21  Rhys Martini, PA-C    Allergies Patient has no known allergies.  Family History  Problem Relation Age of Onset  . Diabetes Maternal Grandfather   . Diabetes Paternal Grandfather     Social History Social History   Tobacco Use  . Smoking status: Former Games developer  . Smokeless tobacco: Never Used  Vaping Use  . Vaping Use: Former  . Quit date: 04/13/2021  Substance Use Topics  . Alcohol use: No  . Drug use: No    Review of Systems  Constitutional: Negative for fever. Eyes: Negative for visual changes. ENT: Positive for sore throat and mucous membrane ulceration. Cardiovascular: Negative for chest pain. Respiratory: Negative for shortness of breath. Gastrointestinal: Negative for abdominal pain, vomiting and diarrhea. Genitourinary: Negative for dysuria. Musculoskeletal: Negative for back pain. Skin: Negative for rash. Neurological: Negative for headaches, focal weakness or numbness. ____________________________________________  PHYSICAL EXAM:  VITAL SIGNS: ED Triage Vitals  Enc Vitals Group     BP 04/19/21 1850 118/81     Pulse Rate 04/19/21 1850 88     Resp 04/19/21 1850 16     Temp 04/19/21 1850 99.4 F (37.4 C)     Temp Source 04/19/21 1850 Oral     SpO2 04/19/21 1850 98 %     Weight 04/19/21 1854 171 lb (77.6 kg)  Height 04/19/21 1854 5\' 1"  (1.549 m)     Head Circumference --      Peak Flow --      Pain Score 04/19/21 1853 8     Pain Loc --      Pain Edu? --      Excl. in GC? --     Constitutional: Alert and oriented. Well appearing and in no distress. Head: Normocephalic and atraumatic. Eyes: Conjunctivae are normal. PERRL. Normal extraocular movements Ears: Canals clear. TMs intact bilaterally. Nose: No congestion/rhinorrhea/epistaxis. Mouth/Throat: Mucous membranes are moist.  Uvula is midline and  tonsils are flat.  Patient with flat shallow ulcers noted to the tongue.  Petechial hemorrhages noted to the soft palate.  Tonsils are without exudate or erythema.  The lower lip appears erythematous and shows some superficial cracking and peeling. Neck: Supple. No thyromegaly. Hematological/Lymphatic/Immunological: No cervical lymphadenopathy. Cardiovascular: Normal rate, regular rhythm. Normal distal pulses. Respiratory: Normal respiratory effort. No wheezes/rales/rhonchi. Gastrointestinal: Soft and nontender. No distention. Musculoskeletal: Nontender with normal range of motion in all extremities.  Neurologic:  Normal gait without ataxia. Normal speech and language. No gross focal neurologic deficits are appreciated. Skin:  Skin is warm, dry and intact. No rash noted. ____________________________________________  PROCEDURES  Acyclovir 400 mg p.o.  Procedures ____________________________________________  INITIAL IMPRESSION / ASSESSMENT AND PLAN / ED COURSE  DDX: aphthous ulcers, herpes oralabialis, stomatitis  Patient ED evaluation of lower lip peeling and cracking as well as cellulitis to the lip.  Clinical picture is consistent with likely viral etiology including a prodrome of fever, body aches, and headache.  Patient presents with stomatitis symptoms and will be treated with antiviral medication.  She is also given a prescription for Magic mouthwash topical pain relief.  She encouraged to follow-up with primary provider or local urgent care for ongoing symptoms.  Return precautions discussed.   Lisa Dominguez was evaluated in Emergency Department on 04/19/2021 for the symptoms described in the history of present illness. She was evaluated in the context of the global COVID-19 pandemic, which necessitated consideration that the patient might be at risk for infection with the SARS-CoV-2 virus that causes COVID-19. Institutional protocols and algorithms that pertain to the evaluation of  patients at risk for COVID-19 are in a state of rapid change based on information released by regulatory bodies including the CDC and federal and state organizations. These policies and algorithms were followed during the patient's care in the ED. ____________________________________________  FINAL CLINICAL IMPRESSION(S) / ED DIAGNOSES  Final diagnoses:  Stomatitis and mucositis      Leiloni Smithers, 04/21/2021, PA-C 04/19/21 2316    04/21/21, MD 04/19/21 2342

## 2021-04-19 NOTE — ED Triage Notes (Signed)
Patient c/o rash on lip and sore throat for one week. Patient was seen at Urgent Care and had a negative strept test.

## 2021-07-04 ENCOUNTER — Ambulatory Visit (INDEPENDENT_AMBULATORY_CARE_PROVIDER_SITE_OTHER): Payer: Medicaid Other | Admitting: Certified Nurse Midwife

## 2021-07-04 ENCOUNTER — Other Ambulatory Visit: Payer: Self-pay

## 2021-07-04 VITALS — BP 116/80 | HR 80 | Ht 61.0 in | Wt 165.9 lb

## 2021-07-04 DIAGNOSIS — Z975 Presence of (intrauterine) contraceptive device: Secondary | ICD-10-CM

## 2021-07-04 NOTE — Progress Notes (Signed)
Pt not aware that Mirena IUD is good x 7 yrs. Reviewed updated approval for use. Pt verbalizes understanding , offered to replace, she declines. Follow up prn or in 2 yrs for IUD replacement.     Lisa Dominguez,CNM

## 2021-08-02 ENCOUNTER — Telehealth: Payer: Self-pay | Admitting: Certified Nurse Midwife

## 2021-08-02 NOTE — Telephone Encounter (Signed)
Pt called asking about tx for vaginal dryness, states uncomfortable just started a day ago. Please Advise.

## 2021-08-02 NOTE — Telephone Encounter (Signed)
Spoke with patient who stated she went to walk in urgent care and had a swab done for BV/Yeast and was waiting on results. Patient agreed to keep [place] updated of results and would call if treatment was required but did not work and make an appointment with [place]. No further questions or concerns voiced from patient.

## 2022-06-26 ENCOUNTER — Encounter: Payer: Self-pay | Admitting: *Deleted

## 2022-06-26 ENCOUNTER — Other Ambulatory Visit: Payer: Self-pay

## 2022-06-26 DIAGNOSIS — K8050 Calculus of bile duct without cholangitis or cholecystitis without obstruction: Secondary | ICD-10-CM | POA: Insufficient documentation

## 2022-06-26 DIAGNOSIS — R7401 Elevation of levels of liver transaminase levels: Secondary | ICD-10-CM | POA: Diagnosis not present

## 2022-06-26 DIAGNOSIS — K8020 Calculus of gallbladder without cholecystitis without obstruction: Secondary | ICD-10-CM | POA: Insufficient documentation

## 2022-06-26 DIAGNOSIS — R1011 Right upper quadrant pain: Secondary | ICD-10-CM | POA: Diagnosis present

## 2022-06-26 LAB — COMPREHENSIVE METABOLIC PANEL
ALT: 57 U/L — ABNORMAL HIGH (ref 0–44)
AST: 30 U/L (ref 15–41)
Albumin: 4.7 g/dL (ref 3.5–5.0)
Alkaline Phosphatase: 78 U/L (ref 38–126)
Anion gap: 10 (ref 5–15)
BUN: 6 mg/dL (ref 6–20)
CO2: 25 mmol/L (ref 22–32)
Calcium: 9.4 mg/dL (ref 8.9–10.3)
Chloride: 101 mmol/L (ref 98–111)
Creatinine, Ser: 0.52 mg/dL (ref 0.44–1.00)
GFR, Estimated: >60 mL/min (ref >60)
Glucose, Bld: 107 mg/dL — ABNORMAL HIGH (ref 70–99)
Potassium: 3.2 mmol/L — ABNORMAL LOW (ref 3.5–5.1)
Sodium: 136 mmol/L (ref 135–145)
Total Bilirubin: 1.4 mg/dL — ABNORMAL HIGH (ref 0.3–1.2)
Total Protein: 8.5 g/dL — ABNORMAL HIGH (ref 6.5–8.1)

## 2022-06-26 LAB — URINALYSIS, ROUTINE W REFLEX MICROSCOPIC
Bacteria, UA: NONE SEEN
Bilirubin Urine: NEGATIVE
Glucose, UA: NEGATIVE mg/dL
Ketones, ur: 5 mg/dL — AB
Leukocytes,Ua: NEGATIVE
Nitrite: NEGATIVE
Protein, ur: NEGATIVE mg/dL
Specific Gravity, Urine: 1.013 (ref 1.005–1.030)
pH: 6 (ref 5.0–8.0)

## 2022-06-26 LAB — CBC
HCT: 35.6 % — ABNORMAL LOW (ref 36.0–46.0)
Hemoglobin: 12.5 g/dL (ref 12.0–15.0)
MCH: 30.7 pg (ref 26.0–34.0)
MCHC: 35.1 g/dL (ref 30.0–36.0)
MCV: 87.5 fL (ref 80.0–100.0)
Platelets: 233 10*3/uL (ref 150–400)
RBC: 4.07 MIL/uL (ref 3.87–5.11)
RDW: 11.8 % (ref 11.5–15.5)
WBC: 10.4 10*3/uL (ref 4.0–10.5)
nRBC: 0 % (ref 0.0–0.2)

## 2022-06-26 LAB — LIPASE, BLOOD: Lipase: 29 U/L (ref 11–51)

## 2022-06-26 LAB — POC URINE PREG, ED: Preg Test, Ur: NEGATIVE

## 2022-06-26 NOTE — ED Triage Notes (Signed)
Pt reports abd pain with vomiting.  No diarrhea.  No vag bleeding.  Pt denies urinary sx.  Pt alert  speech clear.

## 2022-06-26 NOTE — ED Notes (Signed)
Poct pregnancy Negative 

## 2022-06-26 NOTE — ED Provider Triage Note (Signed)
Emergency Medicine Provider Triage Evaluation Note  Lisa Dominguez, a 26 y.o. female  was evaluated in triage.  Pt complains of abdominal pain.  She denies any associated diarrhea, constipation, urinary symptoms, vaginal bleeding.  Endorse some nausea.  Review of Systems  Positive: Abd pain, NV Negative: FCS  Physical Exam  BP (!) 146/93 (BP Location: Left Arm)   Pulse 95   Temp 100 F (37.8 C)   Resp 20   Ht 5\' 1"  (1.549 m)   Wt 79.4 kg   LMP  (LMP Unknown)   SpO2 99%   BMI 33.07 kg/m  Gen:   Awake, no distress  NAd Resp:  Normal effort CTA MSK:   Moves extremities without difficulty  ABD:  Soft, nontender  Medical Decision Making  Medically screening exam initiated at 10:22 PM.  Appropriate orders placed.  Reet Scharrer Sosa was informed that the remainder of the evaluation will be completed by another provider, this initial triage assessment does not replace that evaluation, and the importance of remaining in the ED until their evaluation is complete.  Patient to the ED for evaluation abdominal pain with associated nausea and vomiting.   Thayer Headings, PA-C 06/26/22 2238

## 2022-06-27 ENCOUNTER — Emergency Department
Admission: EM | Admit: 2022-06-27 | Discharge: 2022-06-27 | Disposition: A | Discharge status: Home / Self Care | Payer: Medicaid Other | Attending: Emergency Medicine | Admitting: Emergency Medicine

## 2022-06-27 ENCOUNTER — Emergency Department: Payer: Medicaid Other

## 2022-06-27 DIAGNOSIS — K8050 Calculus of bile duct without cholangitis or cholecystitis without obstruction: Secondary | ICD-10-CM

## 2022-06-27 DIAGNOSIS — K8020 Calculus of gallbladder without cholecystitis without obstruction: Secondary | ICD-10-CM

## 2022-06-27 MED ORDER — LACTATED RINGERS IV BOLUS
1000.0000 mL | Freq: Once | INTRAVENOUS | Status: AC
  Administered 2022-06-27: 1000 mL via INTRAVENOUS

## 2022-06-27 MED ORDER — OXYCODONE HCL 5 MG PO TABS: 5.0000 mg | ORAL_TABLET | Freq: Three times a day (TID) | ORAL | 0 refills | Status: DC | PRN

## 2022-06-27 MED ORDER — ONDANSETRON 4 MG PO TBDP: 4.0000 mg | ORAL_TABLET | Freq: Three times a day (TID) | ORAL | 0 refills | Status: DC | PRN

## 2022-06-27 MED ORDER — FAMOTIDINE IN NACL 20-0.9 MG/50ML-% IV SOLN
20.0000 mg | Freq: Once | INTRAVENOUS | Status: AC
Start: 2022-06-27 — End: 2022-06-27
  Administered 2022-06-27: 20 mg via INTRAVENOUS
  Filled 2022-06-27: qty 50

## 2022-06-27 NOTE — ED Provider Notes (Signed)
[facility] Provider Note    Event Date/Time   First MD Initiated Contact with Patient 06/27/22 0100     (approximate)   History   Abdominal Pain   HPI  Lisa Dominguez is a 26 y.o. female who presents to the ED for evaluation of Abdominal Pain   I reviewed OB/GYN clinic visit from 1 year ago.  Mirena IUD in place.  Patient presents to the ED for evaluation of emesis and upper abdominal pain since Sunday.  She reports a heavy weekend of drinking more than she typically does, she reports "just a lot of tequila."  No other recreational drugs.  Reports sometimes having acid reflux or stomach issues after drinking a lot of liquor.  Reports she fell like she was hung over the next day, on Sunday, but the symptoms persisted throughout the week.  She reports upper abdominal discomfort without chest pain, shortness of breath or cough.  No fevers.  No stool changes or diarrhea.  No dysuria.   Physical Exam   Triage Vital Signs: ED Triage Vitals  Enc Vitals Group     BP 06/26/22 2203 (!) 146/93     Pulse Rate 06/26/22 2203 95     Resp 06/26/22 2203 20     Temp 06/26/22 2203 100 F (37.8 C)     Temp src --      SpO2 06/26/22 2203 99 %     Weight 06/26/22 2205 175 lb (79.4 kg)     Height 06/26/22 2205 5\' 1"  (1.549 m)     Head Circumference --      Peak Flow --      Pain Score 06/26/22 2204 8     Pain Loc --      Pain Edu? --      Excl. in GC? --     Most recent vital signs: Vitals:   06/27/22 0152 06/27/22 0354  BP: (!) 148/78 122/80  Pulse: 89 88  Resp: 18 16  Temp:  98.7 F (37.1 C)  SpO2: 99% 98%    General: Awake, no distress.  CV:  Good peripheral perfusion. RRR Resp:  Normal effort. CTABR Abd:  No distention.  RUQ and epigastric tenderness without peritoneal features.  Lower abdomen is benign. MSK:  No deformity noted.  Neuro:  No focal deficits appreciated. Cranial nerves II through XII intact 5/5 strength and sensation in all 4  extremities Other:     ED Results / Procedures / Treatments   Labs (all labs ordered are listed, but only abnormal results are displayed) Labs Reviewed  COMPREHENSIVE METABOLIC PANEL - Abnormal; Notable for the following components:      Result Value   Potassium 3.2 (*)    Glucose, Bld 107 (*)    Total Protein 8.5 (*)    ALT 57 (*)    Total Bilirubin 1.4 (*)    All other components within normal limits  CBC - Abnormal; Notable for the following components:   HCT 35.6 (*)    All other components within normal limits  URINALYSIS, ROUTINE W REFLEX MICROSCOPIC - Abnormal; Notable for the following components:   Color, Urine YELLOW (*)    APPearance HAZY (*)    Hgb urine dipstick SMALL (*)    Ketones, ur 5 (*)    All other components within normal limits  LIPASE, BLOOD  POC URINE PREG, ED    EKG   RADIOLOGY RUQ ultrasound interpreted by me with cholelithiasis, but no  signs of acute cholecystitis. CXR interpreted by me without evidence of acute cardiopulmonary pathology.  Official radiology report(s): US ABDOMEN LIMITED RUQ (LIVER/GB)  Result Date: 06/27/2022 CLINICAL DATA:  Right upper quadrant pain x3 days. EXAM: ULTRASOUND ABDOMEN LIMITED RIGHT UPPER QUADRANT COMPARISON:  None Available. FINDINGS: Gallbladder: Shadowing, echogenic gallstones are seen within the lumen of a contracted gallbladder. The largest measures approximately 8.3 mm. The gallbladder wall measures 3.0 mm in thickness. No sonographic Murphy sign noted by sonographer. Common bile duct: Diameter: 3.8 mm Liver: No focal lesion identified. Diffusely increased echogenicity of the liver parenchyma is noted. Portal vein is patent on color Doppler imaging with normal direction of blood flow towards the liver. Other: None. IMPRESSION: 1. Cholelithiasis, without evidence of acute cholecystitis. 2. Hepatic steatosis without focal liver lesions. Electronically Signed   By: Aram Candela M.D.   On: 06/27/2022 03:32    DG Chest 2 View  Result Date: 06/27/2022 CLINICAL DATA:  Persistent emesis EXAM: CHEST - 2 VIEW COMPARISON:  None Available. FINDINGS: The heart size and mediastinal contours are within normal limits. Both lungs are clear. The visualized skeletal structures are unremarkable. IMPRESSION: No active cardiopulmonary disease. Electronically Signed   By: Alcide Clever M.D.   On: 06/27/2022 01:41    PROCEDURES and INTERVENTIONS:  Procedures  Medications  lactated ringers bolus 1,000 mL (0 mLs Intravenous Stopped 06/27/22 0319)  famotidine (PEPCID) IVPB 20 mg premix (0 mg Intravenous Stopped 06/27/22 0217)     IMPRESSION / MDM / ASSESSMENT AND PLAN / ED COURSE  I reviewed the triage vital signs and the nursing notes.  Differential diagnosis includes, but is not limited to, cholelithiasis, pancreatitis, cholecystitis, pneumothorax, MSK strain/spasm, rib fracture  {Patient presents with symptoms of an acute illness or injury that is potentially life-threatening.  26 year old female presents to the ED with abdominal pain and emesis for a few days, possibly due to cholelithiasis and ultimately suitable for outpatient management.  Looks systemically well but has RUQ tenderness.  No peritoneal features.  Blood work with marginal elevation of bilirubin and ALT.normal lipase and CBC.  Urine without infectious features.  Small ketones suggestive of dehydration.  Hepatobiliary ultrasound with evidence of gallstones but no evidence of cholecystitis or hepatobiliary obstruction.  No elevated alkaline phosphatase, CBD diameter.  And no persistent symptoms.  We discussed symptom control at home, surgical referral as an outpatient and return precautions for the ED in the meantime.  Clinical Course as of 06/27/22 0625  Thu Jun 27, 2022  0353 Reassessed.  Reports feeling better.  We discussed gallstones, managing these at home and following up with surgery.  Discussed return precautions for the ED. [DS]    Clinical  Course User Index [DS] Delton Prairie, MD     FINAL CLINICAL IMPRESSION(S) / ED DIAGNOSES   Final diagnoses:  Calculus of gallbladder without cholecystitis without obstruction  Biliary colic     Rx / DC Orders   ED Discharge Orders          Ordered    oxyCODONE (ROXICODONE) 5 MG immediate release tablet  Every 8 hours PRN        06/27/22 0355    ondansetron (ZOFRAN-ODT) 4 MG disintegrating tablet  Every 8 hours PRN        06/27/22 0355             Note:  This document was prepared using Dragon voice recognition software and may include unintentional dictation errors.   Delton Prairie, MD 06/27/22 [phone]

## 2022-06-27 NOTE — Discharge Instructions (Signed)
Please reach out to the clinic of a local surgeon to discuss gallstones and having her gallbladder electively removed.  Please take Tylenol and ibuprofen/Advil for your pain.  It is safe to take them together, or to alternate them every few hours.  Take up to 1000mg  of Tylenol at a time, up to 4 times per day.  Do not take more than 4000 mg of Tylenol in 24 hours.  For ibuprofen, take 400-600 mg, 3 - 4 times per day.  Use Zofran as needed for any nausea and vomiting.  Use oxycodone as needed for more severe/breakthrough pain.  Do not drive or operate machinery with this medication as it often makes you sleepy.  If you develop any worsening symptoms despite this then please return to the ED

## 2022-06-27 NOTE — ED Notes (Signed)
Pt ambulated to BR with steady gait.

## 2022-06-27 NOTE — ED Notes (Signed)
AVS with prescriptions provided to and discussed with patient and family member at bedside. Pt verbalizes understanding of discharge instructions and denies any questions or concerns at this time. Pt has ride home. Pt ambulated out of department independently with steady gait.  

## 2023-02-15 ENCOUNTER — Encounter: Payer: Self-pay | Admitting: Emergency Medicine

## 2023-02-15 ENCOUNTER — Ambulatory Visit
Admission: EM | Admit: 2023-02-15 | Discharge: 2023-02-15 | Disposition: A | Discharge status: Home / Self Care | Payer: Medicaid Other | Attending: Family Medicine | Admitting: Family Medicine

## 2023-02-15 DIAGNOSIS — J039 Acute tonsillitis, unspecified: Secondary | ICD-10-CM | POA: Diagnosis present

## 2023-02-15 LAB — GROUP A STREP BY PCR: Group A Strep by PCR: NOT DETECTED

## 2023-02-15 MED ORDER — LIDOCAINE VISCOUS HCL 2 % MT SOLN: 15.0000 mL | Freq: Four times a day (QID) | OROMUCOSAL | 0 refills | Status: DC | PRN

## 2023-02-15 MED ORDER — AMOXICILLIN-POT CLAVULANATE 875-125 MG PO TABS: 1.0000 | ORAL_TABLET | Freq: Two times a day (BID) | ORAL | 0 refills | Status: AC

## 2023-02-15 NOTE — Discharge Instructions (Addendum)
Take the Augmentin twice daily for 10 days for treatment of your sore throat.  Gargle with warm salt water 2-3 times a day to soothe your throat, aid in pain relief, and aid in healing.  Take over-the-counter tylenol and/or ibuprofen according to the package instructions as needed for pain.  You can also use Chloraseptic or Sucrets lozenges, 1 lozenge every 2 hours as needed for throat pain.  Use the viscous lidocaine 15 minutes before meals and at bedtime to help with sore throat pain.  If you develop any new or worsening symptoms return for reevaluation.  If her symptoms not improve following antibiotic she needed follow-up with ear nose and throat for further evaluation.

## 2023-02-15 NOTE — ED Provider Notes (Signed)
[facility]    CSN: [id number] Arrival date & time: 02/15/23  1451      History   Chief Complaint Chief Complaint  Patient presents with   Sore Throat    HPI Lisa Dominguez is a 27 y.o. female.   HPI  27 year old female here for eval of sore throat.  The patient reports that she was evaluated in the emergency department on 01/22/2023 for a sore throat at that time she been having symptoms for approximately a week.  She was tested for strep, mono, and had a full respiratory panel that was all negative.  She was discharged home on Magic mouthwash and viscous lidocaine without any improvement of her symptoms.  She denies any fever, runny nose, nasal congestion.  She states that it hurts significantly when she swallows.  Her throat does feel better immediately after eating or brushing her teeth.  Past Medical History:  Diagnosis Date   Mild pre-eclampsia    Pregnancy induced hypertension    1st pregnancy   Prolonged spontaneous rupture of membranes     Patient Active Problem List   Diagnosis Date Noted   Vitamin D deficiency 01/25/2016   Vaginal delivery 12/10/2015   History of pregnancy induced hypertension 06/09/2015    Past Surgical History:  Procedure Laterality Date   NO PAST SURGERIES      OB History     Gravida  2   Para  2   Term  2   Preterm      AB      Living  1      SAB      IAB      Ectopic      Multiple  0   Live Births  1            Home Medications    Prior to Admission medications   Medication Sig Start Date End Date Taking? Authorizing Provider  amoxicillin-clavulanate (AUGMENTIN) 875-125 MG tablet Take 1 tablet by mouth every 12 (twelve) hours for 10 days. 02/15/23 02/25/23 Yes Margarette Canada, NP  levonorgestrel (MIRENA) 20 MCG/24HR IUD 1 Intra Uterine Device (1 each total) by Intrauterine route once. 02/16/16  Yes Shambley, Melody N, CNM  lidocaine (XYLOCAINE) 2 % solution Use as directed 15 mLs in the mouth  or throat every 6 (six) hours as needed for mouth pain. 02/15/23  Yes Margarette Canada, NP  magic mouthwash (nystatin, lidocaine, diphenhydrAMINE, alum & mag hydroxide) suspension Swish and spit 5 mLs 4 (four) times daily as needed for mouth pain. 04/19/21   Menshew, Dannielle Karvonen, PA-C  ondansetron (ZOFRAN-ODT) 4 MG disintegrating tablet Take 1 tablet (4 mg total) by mouth every 8 (eight) hours as needed. 06/27/22   Vladimir Crofts, MD  oxyCODONE (ROXICODONE) 5 MG immediate release tablet Take 1 tablet (5 mg total) by mouth every 8 (eight) hours as needed. 06/27/22 06/27/23  Vladimir Crofts, MD    Family History Family History  Problem Relation Age of Onset   Diabetes Maternal Grandfather    Diabetes Paternal Grandfather     Social History Social History   Tobacco Use   Smoking status: Former   Smokeless tobacco: Never  Vaping Use   Vaping Use: Former   Quit date: 04/13/2021  Substance Use Topics   Alcohol use: Yes   Drug use: No     Allergies   Patient has no known allergies.   Review of Systems Review of Systems  Constitutional:  Negative for  fever.  HENT:  Positive for sore throat. Negative for congestion and rhinorrhea.      Physical Exam Triage Vital Signs ED Triage Vitals  Enc Vitals Group     BP 02/15/23 1501 114/71     Pulse Rate 02/15/23 1501 85     Resp 02/15/23 1501 16     Temp 02/15/23 1501 98.4 F (36.9 C)     Temp Source 02/15/23 1501 Oral     SpO2 02/15/23 1501 98 %     Weight --      Height --      Head Circumference --      Peak Flow --      Pain Score 02/15/23 1459 7     Pain Loc --      Pain Edu? --      Excl. in [place]? --    No data found.  Updated Vital Signs BP 114/71 (BP Location: Left Arm)   Pulse 85   Temp 98.4 F (36.9 C) (Oral)   Resp 16   SpO2 98%   Visual Acuity Right Eye Distance:   Left Eye Distance:   Bilateral Distance:    Right Eye Near:   Left Eye Near:    Bilateral Near:     Physical Exam Vitals and nursing note reviewed.   Constitutional:      Appearance: Normal appearance. She is not ill-appearing.  HENT:     Head: Normocephalic and atraumatic.     Mouth/Throat:     Mouth: Mucous membranes are moist.     Pharynx: Oropharyngeal exudate and posterior oropharyngeal erythema present.     Comments: Patient has 1+ edematous and erythematous tonsillar pillars with white exudate. Neck:     Comments: Bilateral anterior cervical lymphadenopathy present on exam. Musculoskeletal:     Cervical back: Normal range of motion and neck supple.  Lymphadenopathy:     Cervical: Cervical adenopathy present.  Skin:    General: Skin is warm and dry.     Capillary Refill: Capillary refill takes less than 2 seconds.     Findings: No rash.  Neurological:     Mental Status: She is alert.      UC Treatments / Results  Labs (all labs ordered are listed, but only abnormal results are displayed) Labs Reviewed  GROUP A STREP BY PCR    EKG   Radiology No results found.  Procedures Procedures (including critical care time)  Medications Ordered in UC Medications - No data to display  Initial Impression / Assessment and Plan / UC Course  I have reviewed the triage vital signs and the nursing notes.  Pertinent labs & imaging results that were available during my care of the patient were reviewed by me and considered in my medical decision making (see chart for details).   Patient is a pleasant and nontoxic-appearing 27 year old female here for evaluation of 3+ weeks of sore throat.  Initially she had URI symptoms but those have all resolved and the sore throat has remained.  She is continues to be afebrile.  She also complaining of some itchiness to her tongue but denies any white coating or burning.  She states that her throat feels better after she eats or drinks fluids.  On exam her tonsillar pillars are erythematous and edematous with white exudate.  She also has anterior cervical lymphadenopathy on exam.  She had a  negative strep test when she was in the ER, with negative mono and respiratory panel.  Given  her erythema and edema of her tonsils with white exudate and also her lymphadenopathy I will treat her for tonsillitis with Augmentin 875 twice daily for 10 days.  I will also prescribe her some viscous lidocaine that she can use 15 minutes before meals and at bedtime to help with throat pain.  I have advised the patient that if her symptoms do not improve that she needs to follow-up with ear nose and throat for further workup and evaluation.   Final Clinical Impressions(s) / UC Diagnoses   Final diagnoses:  Tonsillitis     Discharge Instructions      Take the Augmentin twice daily for 10 days for treatment of your sore throat.  Gargle with warm salt water 2-3 times a day to soothe your throat, aid in pain relief, and aid in healing.  Take over-the-counter tylenol and/or ibuprofen according to the package instructions as needed for pain.  You can also use Chloraseptic or Sucrets lozenges, 1 lozenge every 2 hours as needed for throat pain.  Use the viscous lidocaine 15 minutes before meals and at bedtime to help with sore throat pain.  If you develop any new or worsening symptoms return for reevaluation.  If her symptoms not improve following antibiotic she needed follow-up with ear nose and throat for further evaluation.     ED Prescriptions     Medication Sig Dispense Auth. Provider   amoxicillin-clavulanate (AUGMENTIN) 875-125 MG tablet Take 1 tablet by mouth every 12 (twelve) hours for 10 days. 20 tablet Margarette Canada, NP   lidocaine (XYLOCAINE) 2 % solution Use as directed 15 mLs in the mouth or throat every 6 (six) hours as needed for mouth pain. 100 mL Margarette Canada, NP      PDMP not reviewed this encounter.   Margarette Canada, NP 02/15/23 [phone]

## 2023-02-15 NOTE — ED Triage Notes (Signed)
Pt has a sore throat x 2 weeks. She was seen at the ED on 01/22/23 and is not better.

## 2023-02-17 ENCOUNTER — Emergency Department
Admission: EM | Admit: 2023-02-17 | Discharge: 2023-02-17 | Disposition: A | Discharge status: Home / Self Care | Payer: Medicaid Other | Attending: Emergency Medicine | Admitting: Emergency Medicine

## 2023-02-17 ENCOUNTER — Encounter: Payer: Self-pay | Admitting: Emergency Medicine

## 2023-02-17 ENCOUNTER — Other Ambulatory Visit: Payer: Self-pay

## 2023-02-17 DIAGNOSIS — K141 Geographic tongue: Secondary | ICD-10-CM | POA: Diagnosis not present

## 2023-02-17 DIAGNOSIS — Z20822 Contact with and (suspected) exposure to covid-19: Secondary | ICD-10-CM | POA: Insufficient documentation

## 2023-02-17 DIAGNOSIS — J029 Acute pharyngitis, unspecified: Secondary | ICD-10-CM | POA: Insufficient documentation

## 2023-02-17 LAB — RESP PANEL BY RT-PCR (RSV, FLU A&B, COVID)  RVPGX2
Influenza A by PCR: NEGATIVE
Influenza B by PCR: NEGATIVE
Resp Syncytial Virus by PCR: NEGATIVE
SARS Coronavirus 2 by RT PCR: NEGATIVE

## 2023-02-17 LAB — GROUP A STREP BY PCR: Group A Strep by PCR: NOT DETECTED

## 2023-02-17 MED ORDER — CLOTRIMAZOLE 10 MG MT TROC: 10.0000 mg | Freq: Every day | OROMUCOSAL | 0 refills | Status: DC

## 2023-02-17 NOTE — ED Triage Notes (Signed)
Pt endorses sore throat for awhile. Now it feels like her tongue is dry and has bumps.

## 2023-02-17 NOTE — Discharge Instructions (Addendum)
Your exam is reassuring. You are being treated with an anti-fungal throat lozenge for possible yeast infection causing your tongue/throat irration. Complete the previously prescribed antibiotic and follow-up with [facility] or a local urgent care.

## 2023-02-18 NOTE — ED Provider Notes (Signed)
[facility] Emergency Department Provider Note     Event Date/Time   First MD Initiated Contact with Patient 02/17/23 1914     (approximate)   History   Sore Throat   HPI  Lisa Dominguez is a 27 y.o. female presents to the ED for evaluation of ongoing sore throat pain as well as dry mouth, and bumps on the top of the tongue.  Patient has been evaluated over the last 3 weeks with repeated viral test as well as strep test and monotest, for her ongoing symptoms.  She localizes pain to the left side of the throat associated with swallowing patient denies any difficulty breathing, swallowing, controlling oral secretions.  She also notes of the last week she has had some change to the surface of her tongue, but he feels like her tongue is inflamed and dry.  Patient denies any true fevers, chills, or sweats.  She has recently completed a course of antibiotics for presumed bacterial throat infection.  Patient denies any recent dental work, GERD symptoms, or any ongoing nausea vomiting.   Physical Exam   Triage Vital Signs: ED Triage Vitals [02/17/23 1722]  Enc Vitals Group     BP 136/84     Pulse Rate 77     Resp 16     Temp 98.1 F (36.7 C)     Temp Source Oral     SpO2 100 %     Weight 174 lb 2.6 oz (79 kg)     Height '5\' 1"'$  (1.549 m)     Head Circumference      Peak Flow      Pain Score 7     Pain Loc      Pain Edu?      Excl. in [place]?     Most recent vital signs: Vitals:   02/17/23 1722 02/17/23 2015  BP: 136/84 136/81  Pulse: 77 79  Resp: 16 16  Temp: 98.1 F (36.7 C)   SpO2: 100% 100%    General Awake, no distress. NAD HEENT NCAT. PERRL. EOMI. No rhinorrhea. Mucous membranes are moist.  Uvula is midline and tonsils are flat.  Oropharyngeal lesions are appreciated.  Patient with a mild geographic tongue with some serpentine changes to the anterior papillae.  No aphthous ulcers appreciated.  No brawny sublingual edema noted.  Local gum  swelling is noted.  No palpable lymphadenopathy noted.  No thyromegaly appreciated. CV:  Good peripheral perfusion.  RESP:  Normal effort.  ABD:  No distention.     ED Results / Procedures / Treatments   Labs (all labs ordered are listed, but only abnormal results are displayed) Labs Reviewed  GROUP A STREP BY PCR  RESP PANEL BY RT-PCR (RSV, FLU A&B, COVID)  [id number]     EKG   RADIOLOGY   No results found.   PROCEDURES:  Critical Care performed: No  Procedures   MEDICATIONS ORDERED IN ED: Medications - No data to display   IMPRESSION / MDM / [place] / ED COURSE  I reviewed the triage vital signs and the nursing notes.                              Differential diagnosis includes, but is not limited to, aphthous ulcer, viral URI, strep pharyngitis, viral pharyngitis, geographic tongue, glossitis  Patient's presentation is most consistent with acute complicated illness / injury requiring diagnostic workup.  Patient's diagnosis is consistent with mild glossitis and geographic tongue as well as sore throat of unclear etiology.  Again with negative viral panel test and strep PCR at this time.  Patient will be discharged home with prescriptions for clotrimazole troches. Patient is to follow up with ENT or dental provider for ongoing symptoms.  As needed or otherwise directed. Patient is given ED precautions to return to the ED for any worsening or new symptoms.  FINAL CLINICAL IMPRESSION(S) / ED DIAGNOSES   Final diagnoses:  Sore throat  Geographic tongue     Rx / DC Orders   ED Discharge Orders          Ordered    clotrimazole (MYCELEX) 10 MG troche  5 times daily        02/17/23 2000             Note:  This document was prepared using Dragon voice recognition software and may include unintentional dictation errors.    Melvenia Needles, PA-C 02/18/23 1541    Arta Silence, MD 02/19/23 1601

## 2023-03-06 ENCOUNTER — Emergency Department
Admission: EM | Admit: 2023-03-06 | Discharge: 2023-03-06 | Disposition: A | Discharge status: Home / Self Care | Payer: Medicaid Other | Attending: Emergency Medicine | Admitting: Emergency Medicine

## 2023-03-06 ENCOUNTER — Encounter: Payer: Self-pay | Admitting: Emergency Medicine

## 2023-03-06 DIAGNOSIS — J029 Acute pharyngitis, unspecified: Secondary | ICD-10-CM | POA: Insufficient documentation

## 2023-03-06 DIAGNOSIS — K148 Other diseases of tongue: Secondary | ICD-10-CM | POA: Diagnosis not present

## 2023-03-06 DIAGNOSIS — R1310 Dysphagia, unspecified: Secondary | ICD-10-CM | POA: Diagnosis not present

## 2023-03-06 LAB — GROUP A STREP BY PCR: Group A Strep by PCR: NOT DETECTED

## 2023-03-06 NOTE — ED Provider Notes (Signed)
[facility] Provider Note   CSN: [id number] Arrival date & time: 03/06/23  2253     History  Chief Complaint  Patient presents with   Sore Throat    Lisa Dominguez is a 27 y.o. female.  Presents to the emergency department for evaluation of several months of discomfort in her throat.  Patient states when she is trying to swallow her saliva she has some discomfort.  She has no discomfort or difficulty swallowing food or fluids.  She denies any fevers, vomiting, chest pain, shortness of breath, diarrhea.  Intermittently she will have a sore lesion on her tongue that feels like ulcer.  She has used Magic mouthwash but only gets temporary relief for 15 or 20 minutes.  She has 1 ulcer on her tongue that this developed.  She states these ulcers occur periodically maybe once a month.  She denies any other ulcers or lesions throughout her body.  HPI     Home Medications Prior to Admission medications   Medication Sig Start Date End Date Taking? Authorizing Provider  clotrimazole (MYCELEX) 10 MG troche Take 1 tablet (10 mg total) by mouth 5 (five) times daily. 02/17/23   Menshew, Dannielle Karvonen, PA-C  levonorgestrel (MIRENA) 20 MCG/24HR IUD 1 Intra Uterine Device (1 each total) by Intrauterine route once. 02/16/16   Shambley, Melody N, CNM  lidocaine (XYLOCAINE) 2 % solution Use as directed 15 mLs in the mouth or throat every 6 (six) hours as needed for mouth pain. 02/15/23   Margarette Canada, NP  magic mouthwash (nystatin, lidocaine, diphenhydrAMINE, alum & mag hydroxide) suspension Swish and spit 5 mLs 4 (four) times daily as needed for mouth pain. 04/19/21   Menshew, Dannielle Karvonen, PA-C  ondansetron (ZOFRAN-ODT) 4 MG disintegrating tablet Take 1 tablet (4 mg total) by mouth every 8 (eight) hours as needed. 06/27/22   Vladimir Crofts, MD  oxyCODONE (ROXICODONE) 5 MG immediate release tablet Take 1 tablet (5 mg total) by mouth every 8 (eight) hours as needed.  06/27/22 06/27/23  Vladimir Crofts, MD      Allergies    Patient has no known allergies.    Review of Systems   Review of Systems  Physical Exam Updated Vital Signs BP 119/77 (BP Location: Left Arm)   Pulse 80   Temp 98.5 F (36.9 C) (Oral)   Resp 18   Ht '5\' 1"'$  (1.549 m)   Wt 74.8 kg   SpO2 99%   BMI 31.18 kg/m  Physical Exam Constitutional:      Appearance: She is well-developed.  HENT:     Head: Normocephalic and atraumatic.     Comments: Small 1 cm ulcer lateral tongue on the right side.  Superficial with no plaques.  No other lesions intraorally.  No pharyngeal lesions noted.    Right Ear: Tympanic membrane and ear canal normal. Tympanic membrane is not erythematous.     Left Ear: Tympanic membrane and ear canal normal. Tympanic membrane is not erythematous.     Nose: No congestion or rhinorrhea.     Mouth/Throat:     Pharynx: Oropharynx is clear. Uvula midline. No pharyngeal swelling, oropharyngeal exudate, posterior oropharyngeal erythema or uvula swelling.     Tonsils: No tonsillar exudate or tonsillar abscesses.  Eyes:     Conjunctiva/sclera: Conjunctivae normal.  Neck:     Vascular: No carotid bruit.     Comments: No palpable abnormality along the anterior cervical spine Cardiovascular:  Rate and Rhythm: Normal rate and regular rhythm.     Heart sounds: Normal heart sounds.  Pulmonary:     Effort: Pulmonary effort is normal. No respiratory distress.  Musculoskeletal:        General: Normal range of motion.     Cervical back: Normal range of motion and neck supple. No tenderness.  Lymphadenopathy:     Cervical: No cervical adenopathy.  Skin:    General: Skin is warm.     Findings: No rash.  Neurological:     General: No focal deficit present.     Mental Status: She is alert and oriented to person, place, and time. Mental status is at baseline.  Psychiatric:        Behavior: Behavior normal.        Thought Content: Thought content normal.     ED Results  / Procedures / Treatments   Labs (all labs ordered are listed, but only abnormal results are displayed) Labs Reviewed  GROUP A STREP BY PCR    EKG None  Radiology No results found.  Procedures Procedures    Medications Ordered in ED Medications - No data to display  ED Course/ Medical Decision Making/ A&P                             Medical Decision Making  27 year old female with chronic discomfort in her throat.  She has no signs of any infectious process.  Vital signs are stable afebrile tolerating p.o. well.  Occasionally she is describing discomfort in her throat when she swallows mostly saliva.  She also recently developed a small ulcer on her tongue, no plaques.  No other signs of oral lesions.  She has had these ulcerations occasionally and uses [facility] Magic mouthwash which gives her 15 to 20 minutes worth relief.  Patient's vital signs are stable.  She appears well, no signs of dehydration.  She has no sign of any infectious process at this time.  I have encouraged her to establish care with a primary care provider or specialist such as ENT or GI to discuss her chronic discomfort with swallowing.  Patient states she has scheduled appointment with specialist but it will be 6 weeks before she can be seen.  I have recommended she establish care with PCP for routine health care, we showed her how to establish care with [facility] health primary care provider and she is aware that she can schedule her own appointment as soon as Monday morning.  She understands signs and symptoms to return to the ER for. Final Clinical Impression(s) / ED Diagnoses Final diagnoses:  Sore throat  Dysphagia, unspecified type    Rx / DC Orders ED Discharge Orders     None         Renata Caprice 03/06/23 2333    Harvest Dark, MD 03/07/23 2124

## 2023-03-06 NOTE — ED Triage Notes (Signed)
Pt presents ambulatory to triage via POV with complaints of sore throat for the last several weeks. Pt has tried OTC medication with no improvement. States its "sharp" pain when swallowing. Airway patent. A&Ox4 at this time. Denies CP or SOB.

## 2023-03-06 NOTE — ED Notes (Signed)
E-signature pad unavailable - Pt verbalized understanding of D/C information - no additional concerns at this time.  

## 2023-03-07 NOTE — Progress Notes (Unsigned)
New patient visit   Patient: Lisa Dominguez   DOB: [birth year]   27 y.o. Female  MRN: [id number] Visit Date: 03/10/2023  Today's healthcare provider: Mardene Speak, PA-C  CC: establish care, was referred by [facility] ED, throat painx several mo  Subjective    Lisa Dominguez is a 27 y.o. female who presents today as a new patient to establish care.  HPI  Lisa Dominguez is a 27 y.o. female.  Was seen at [facility] for evaluation of several months of discomfort in her throat. She has mouth sores and uses magic mouthwash with relief.  Pt was advised to schedule an appt with GI or ENT for proper evaluation as well as establish care with PCP   Past Medical History:  Diagnosis Date   Mild pre-eclampsia    Pregnancy induced hypertension    1st pregnancy   Prolonged spontaneous rupture of membranes    Past Surgical History:  Procedure Laterality Date   NO PAST SURGERIES     Family Status  Relation Name Status   Mother  Alive   Father  Alive   MGF  (Not Specified)   PGF  (Not Specified)   Family History  Problem Relation Age of Onset   Diabetes Maternal Grandfather    Diabetes Paternal Grandfather    Social History   Socioeconomic History   Marital status: Single    Spouse name: Not on file   Number of children: Not on file   Years of education: Not on file   Highest education level: Not on file  Occupational History   Not on file  Tobacco Use   Smoking status: Former   Smokeless tobacco: Never  Vaping Use   Vaping Use: Former   Quit date: 04/13/2021  Substance and Sexual Activity   Alcohol use: Not Currently   Drug use: No   Sexual activity: Yes    Birth control/protection: None, I.U.D.  Other Topics Concern   Not on file  Social History Narrative   Not on file   Social Determinants of Health   Financial Resource Strain: Not on file  Food Insecurity: Not on file  Transportation Needs: Not on file  Physical Activity: Not on file  Stress: Not on file   Social Connections: Not on file   Outpatient Medications Prior to Visit  Medication Sig   clotrimazole (MYCELEX) 10 MG troche Take 1 tablet (10 mg total) by mouth 5 (five) times daily. (Patient not taking: Reported on 03/10/2023)   levonorgestrel (MIRENA) 20 MCG/24HR IUD 1 Intra Uterine Device (1 each total) by Intrauterine route once. (Patient not taking: Reported on 03/10/2023)   lidocaine (XYLOCAINE) 2 % solution Use as directed 15 mLs in the mouth or throat every 6 (six) hours as needed for mouth pain. (Patient not taking: Reported on 03/10/2023)   magic mouthwash (nystatin, lidocaine, diphenhydrAMINE, alum & mag hydroxide) suspension Swish and spit 5 mLs 4 (four) times daily as needed for mouth pain. (Patient not taking: Reported on 03/10/2023)   ondansetron (ZOFRAN-ODT) 4 MG disintegrating tablet Take 1 tablet (4 mg total) by mouth every 8 (eight) hours as needed. (Patient not taking: Reported on 03/10/2023)   oxyCODONE (ROXICODONE) 5 MG immediate release tablet Take 1 tablet (5 mg total) by mouth every 8 (eight) hours as needed. (Patient not taking: Reported on 03/10/2023)   No facility-administered medications prior to visit.   No Known Allergies  Immunization History  Administered Date(s) Administered   Influenza,inj,Quad PF,6+ Mos  09/05/2015   Tdap 09/27/2015    Health Maintenance  Topic Date Due   COVID-19 Vaccine (1) Never done   Hepatitis C Screening  Never done   PAP-Cervical Cytology Screening  Never done   PAP SMEAR-Modifier  Never done   INFLUENZA VACCINE  07/23/2022   DTaP/Tdap/Td (2 - Td or Tdap) 09/26/2025   HIV Screening  Completed   HPV VACCINES  Aged Out    Patient Care Team: Mardene Speak, Hershal Coria as PCP - General (Physician Assistant)  Review of Systems  Constitutional:  Positive for appetite change.  HENT:  Positive for mouth sores, sore throat, trouble swallowing and voice change.   Respiratory:  Positive for chest tightness.        Objective    BP  131/77 (BP Location: Right Arm, Patient Position: Sitting, Cuff Size: Normal)   Pulse 85   Temp 99.2 F (37.3 C) (Oral)   Wt 152 lb (68.9 kg)   SpO2 99%   BMI 28.72 kg/m    Physical Exam Vitals reviewed.  Constitutional:      General: She is not in acute distress.    Appearance: Normal appearance. She is well-developed. She is not diaphoretic.  HENT:     Head: Normocephalic and atraumatic.  Eyes:     General: No scleral icterus.    Conjunctiva/sclera: Conjunctivae normal.  Neck:     Thyroid: No thyromegaly.  Cardiovascular:     Rate and Rhythm: Normal rate and regular rhythm.     Pulses: Normal pulses.     Heart sounds: Normal heart sounds. No murmur heard. Pulmonary:     Effort: Pulmonary effort is normal. No respiratory distress.     Breath sounds: Normal breath sounds. No wheezing, rhonchi or rales.  Musculoskeletal:     Cervical back: Neck supple.     Right lower leg: No edema.     Left lower leg: No edema.  Lymphadenopathy:     Cervical: No cervical adenopathy.  Skin:    General: Skin is warm and dry.     Findings: No rash.  Neurological:     Mental Status: She is alert and oriented to person, place, and time. Mental status is at baseline.  Psychiatric:        Mood and Affect: Mood normal.        Behavior: Behavior normal.     Depression Screen    03/10/2023    2:15 PM 01/24/2016    2:54 PM  PHQ 2/9 Scores  PHQ - 2 Score 3 0  PHQ- 9 Score 5    No results found for any visits on 03/10/23.  Assessment & Plan     1. Throat pain in adult 2. Mouth sores 3. Other dysphagia Recurrent problem, per chart review from 2022, 2023, 2024 Unclear etiology Vitals are normal, there is a weight loss of 22 pounds since 02/17/23 Referrals to ENT , GI was placed  Review of labs from 03/06/23 unremarkable except some leukocytes, bacteria present in her UA. No treatment on a file. Was asymptomatic at the present. Will need to repeat her UA and urine culture/ possible STD  evaluation  If symptomatic The patient was advised to call back or seek an in-person evaluation if the symptoms worsen or if the condition fails to improve as anticipated. Of note , her phone is not working as she could not pay for it. She planned to fix it tomorrow. She requested to contact her through her mother Nadene Rubins [phone]  [phone] who speaks only [place]. Tried to contact her but her phone did not work.   No follow-ups on file.     I discussed the assessment and treatment plan with the patient. The patient was provided an opportunity to ask questions and all were answered. The patient agreed with the plan and demonstrated an understanding of the instructions.  I, Mardene Speak, PA-C have reviewed all documentation for this visit. The documentation on 03/10/23 for the exam, diagnosis, procedures, and orders are all accurate and complete.  Mardene Speak, [employer], [place] [phone] (phone) [phone] (fax)   [place]

## 2023-03-10 ENCOUNTER — Telehealth: Payer: Self-pay | Admitting: Physician Assistant

## 2023-03-10 ENCOUNTER — Ambulatory Visit (INDEPENDENT_AMBULATORY_CARE_PROVIDER_SITE_OTHER): Payer: Medicaid Other | Admitting: Physician Assistant

## 2023-03-10 ENCOUNTER — Encounter: Payer: Self-pay | Admitting: Physician Assistant

## 2023-03-10 VITALS — BP 131/77 | HR 85 | Temp 99.2°F | Wt 152.0 lb

## 2023-03-10 DIAGNOSIS — K1379 Other lesions of oral mucosa: Secondary | ICD-10-CM

## 2023-03-10 DIAGNOSIS — R1319 Other dysphagia: Secondary | ICD-10-CM | POA: Diagnosis not present

## 2023-03-10 DIAGNOSIS — Z7689 Persons encountering health services in other specified circumstances: Secondary | ICD-10-CM

## 2023-03-10 DIAGNOSIS — R070 Pain in throat: Secondary | ICD-10-CM | POA: Diagnosis not present

## 2023-03-10 MED ORDER — LIDOCAINE VISCOUS HCL 2 % MT SOLN
5.0000 mL | Freq: Four times a day (QID) | OROMUCOSAL | 0 refills | Status: DC | PRN
Start: 2023-03-10 — End: 2023-05-07

## 2023-03-10 MED ORDER — DIPHENHYDRAMINE HCL 12.5 MG/5ML PO LIQD
5.0000 mL | Freq: Four times a day (QID) | ORAL | 0 refills | Status: DC | PRN
Start: 2023-03-10 — End: 2023-03-10

## 2023-03-10 MED ORDER — NYSTATIN 100000 UNIT/ML MT SUSP: 5.0000 mL | Freq: Four times a day (QID) | OROMUCOSAL | 0 refills | Status: DC | PRN

## 2023-03-10 NOTE — Telephone Encounter (Signed)
[facility] called reporting that they do not make compound drugs. The tech says [facility] in [place] does   magic mouthwash (nystatin, lidocaine, diphenhydrAMINE, alum & mag hydroxide) suspension

## 2023-03-12 ENCOUNTER — Encounter: Payer: Self-pay | Admitting: Physician Assistant

## 2023-03-13 DIAGNOSIS — K8020 Calculus of gallbladder without cholecystitis without obstruction: Secondary | ICD-10-CM | POA: Insufficient documentation

## 2023-03-21 NOTE — Telephone Encounter (Signed)
Patient reports don't need prescription. She is feeling better and had used an old prescription she had left.

## 2023-04-23 NOTE — Progress Notes (Deleted)
Lisa Dominguez,acting as a Neurosurgeon for [employer], Lisa Dominguez.,have documented all relevant documentation on the behalf of Lisa Dominguez, Lisa Dominguez,as directed by  [employer], Lisa Dominguez while in the presence of [employer], Lisa Dominguez.    Complete physical exam   Patient: Lisa Dominguez   DOB: 1996/09/12   26 y.o. Female  MRN: [id number] Visit Date: 04/24/2023  Today's healthcare provider: Debera Dominguez, Lisa Dominguez   No chief complaint on file.  Subjective    Lisa Dominguez is a 27 y.o. female who presents today for a complete physical exam.  She reports consuming a {diet types:17450} diet. {Exercise:19826} She generally feels {well/fairly well/poorly:18703}. She reports sleeping {well/fairly well/poorly:18703}. She {does/does not:200015} have additional problems to discuss today.  HPI  ***  Past Medical History:  Diagnosis Date   Mild pre-eclampsia    Pregnancy induced hypertension    1st pregnancy   Prolonged spontaneous rupture of membranes    Past Surgical History:  Procedure Laterality Date   NO PAST SURGERIES     Social History   Socioeconomic History   Marital status: Single    Spouse name: Not on file   Number of children: Not on file   Years of education: Not on file   Highest education level: Not on file  Occupational History   Not on file  Tobacco Use   Smoking status: Former   Smokeless tobacco: Never  Vaping Use   Vaping Use: Former   Quit date: 04/13/2021  Substance and Sexual Activity   Alcohol use: Not Currently   Drug use: No   Sexual activity: Yes    Birth control/protection: None, I.U.D.  Other Topics Concern   Not on file  Social History Narrative   Not on file   Social Determinants of Health   Financial Resource Strain: Not on file  Food Insecurity: Not on file  Transportation Needs: Not on file  Physical Activity: Not on file  Stress: Not on file  Social Connections: Not on file  Intimate Partner Violence: Not on file   Family Status   Relation Name Status   Mother  Alive   Father  Alive   MGF  (Not Specified)   PGF  (Not Specified)   Family History  Problem Relation Age of Onset   Diabetes Maternal Grandfather    Diabetes Paternal Grandfather    No Known Allergies  Patient Care Team: Lisa Dominguez, Lisa Dominguez as PCP - General (Physician Assistant)   Medications: Outpatient Medications Prior to Visit  Medication Sig   clotrimazole (MYCELEX) 10 MG troche Take 1 tablet (10 mg total) by mouth 5 (five) times daily. (Patient not taking: Reported on 03/10/2023)   levonorgestrel (MIRENA) 20 MCG/24HR IUD 1 Intra Uterine Device (1 each total) by Intrauterine route once. (Patient not taking: Reported on 03/10/2023)   magic mouthwash (nystatin, lidocaine, diphenhydrAMINE, alum & mag hydroxide) suspension Swish and spit 5 mLs 4 (four) times daily as needed for mouth pain.   ondansetron (ZOFRAN-ODT) 4 MG disintegrating tablet Take 1 tablet (4 mg total) by mouth every 8 (eight) hours as needed. (Patient not taking: Reported on 03/10/2023)   oxyCODONE (ROXICODONE) 5 MG immediate release tablet Take 1 tablet (5 mg total) by mouth every 8 (eight) hours as needed. (Patient not taking: Reported on 03/10/2023)   No facility-administered medications prior to visit.    Review of Systems  Constitutional: Negative.   HENT: Negative.    Eyes: Negative.   Respiratory: Negative.    Cardiovascular: Negative.  Gastrointestinal: Negative.   Endocrine: Negative.   Genitourinary: Negative.   Musculoskeletal: Negative.   Skin: Negative.   Allergic/Immunologic: Negative.   Neurological: Negative.   Hematological: Negative.   Psychiatric/Behavioral: Negative.      {Labs  Heme  Chem  Endocrine  Serology  Results Review (optional):23779}  Objective    There were no vitals taken for this visit. {Show previous vital signs (optional):23777}   Physical Exam  ***  Last depression screening scores    03/10/2023    2:15 PM 01/24/2016     2:54 PM  PHQ 2/9 Scores  PHQ - 2 Score 3 0  PHQ- 9 Score 5    Last fall risk screening    03/10/2023    2:15 PM  Fall Risk   Falls in the past year? 0  Number falls in past yr: 0  Injury with Fall? 0   Last Audit-C alcohol use screening    03/10/2023    2:15 PM  Alcohol Use Disorder Test (AUDIT)  1. How often do you have a drink containing alcohol? 1  2. How many drinks containing alcohol do you have on a typical day when you are drinking? 1  3. How often do you have six or more drinks on one occasion? 1  AUDIT-C Score 3   A score of 3 or more in women, and 4 or more in men indicates increased risk for alcohol abuse, EXCEPT if all of the points are from question 1   No results found for any visits on 04/24/23.  Assessment & Plan    Routine Health Maintenance and Physical Exam  Exercise Activities and Dietary recommendations  Goals   None     Immunization History  Administered Date(s) Administered   Influenza,inj,Quad PF,6+ Mos 09/05/2015   Tdap 09/27/2015    Health Maintenance  Topic Date Due   COVID-19 Vaccine (1) Never done   Hepatitis C Screening  Never done   PAP-Cervical Cytology Screening  Never done   PAP SMEAR-Modifier  Never done   INFLUENZA VACCINE  07/24/2023   DTaP/Tdap/Td (2 - Td or Tdap) 09/26/2025   HIV Screening  Completed   HPV VACCINES  Aged Out    Discussed health benefits of physical activity, and encouraged her to engage in regular exercise appropriate for her age and condition.  ***  No follow-ups on file.     {provider attestation***:1}   Lisa Dominguez, Lisa Dominguez  [facility] [phone] (phone) [phone] (fax)  [facility]

## 2023-04-24 ENCOUNTER — Encounter: Payer: Medicaid Other | Admitting: Physician Assistant

## 2023-04-30 ENCOUNTER — Telehealth: Payer: Medicaid Other | Admitting: Gastroenterology

## 2023-05-07 ENCOUNTER — Encounter: Payer: Self-pay | Admitting: Gastroenterology

## 2023-05-07 ENCOUNTER — Telehealth (INDEPENDENT_AMBULATORY_CARE_PROVIDER_SITE_OTHER): Payer: Medicaid Other | Admitting: Gastroenterology

## 2023-05-07 VITALS — Ht 61.0 in | Wt 165.0 lb

## 2023-05-07 DIAGNOSIS — R1310 Dysphagia, unspecified: Secondary | ICD-10-CM

## 2023-05-07 MED ORDER — PANTOPRAZOLE SODIUM 40 MG PO TBEC: 40.0000 mg | DELAYED_RELEASE_TABLET | Freq: Every day | ORAL | 0 refills | Status: DC

## 2023-05-07 NOTE — Progress Notes (Signed)
Lisa Minium, MD [address]  [address]  [place], [place] [address]  Main: [phone]  Fax: [phone]    Gastroenterology Virtual/Video Visit  Referring Provider:     Debera Lat, PA-C Primary Care Physician:  Debera Lat, PA-C Primary Gastroenterologist:  Dr.Patryce Depriest Servando Snare Reason for Consultation:     Throat pain        HPI:    Virtual Visit via Video Note Location of the patient: Home Location of provider: Home Office Participating persons: The patient and myself.  I connected with Lisa Dominguez on 05/07/23 at 10:00 AM EDT by a video enabled telemedicine application and verified that I am speaking with the correct person using two identifiers.   I discussed the limitations of evaluation and management by telemedicine and the availability of in person appointments. The patient expressed understanding and agreed to proceed.  Verbal consent to proceed obtained.  History of Present Illness: Lisa Dominguez is a 27 y.o. female referred by Dr. Debera Lat, PA-C  for consultation & management of throat pain.  This patient is being seen today for throat pain.  The patient was in the emergency department in March for throat pain.  The patient was told to follow-up with her PCP/GI/ENT and saw her PCP 4 days later.  At that time the patient was reporting painful throat with sores in her mouth that has been responding to Magic mouthwash.  It was reported that the symptoms had been going on for many months. The patient reports that the mouth sores have gone away and she denies any problems with swallowing food.  She states that she has to turn her head a certain way to make the pain not they are when she swallows.  Once it goes past her throat she has no further discomfort.  There is no report of any unexplained weight loss fevers chills nausea or vomiting.  The patient just recently had her gallbladder out.  Past Medical History:  Diagnosis Date   Mild pre-eclampsia     Pregnancy induced hypertension    1st pregnancy   Prolonged spontaneous rupture of membranes     Past Surgical History:  Procedure Laterality Date   NO PAST SURGERIES      Prior to Admission medications   Medication Sig Start Date End Date Taking? Authorizing Provider  clotrimazole (MYCELEX) 10 MG troche Take 1 tablet (10 mg total) by mouth 5 (five) times daily. Patient not taking: Reported on 03/10/2023 02/17/23   Menshew, Charlesetta Ivory, PA-C  levonorgestrel (MIRENA) 20 MCG/24HR IUD 1 Intra Uterine Device (1 each total) by Intrauterine route once. Patient not taking: Reported on 03/10/2023 02/16/16   Purcell Nails, CNM  magic mouthwash (nystatin, lidocaine, diphenhydrAMINE, alum & mag hydroxide) suspension Swish and spit 5 mLs 4 (four) times daily as needed for mouth pain. 03/10/23   Ostwalt, Edmon Crape, PA-C  ondansetron (ZOFRAN-ODT) 4 MG disintegrating tablet Take 1 tablet (4 mg total) by mouth every 8 (eight) hours as needed. Patient not taking: Reported on 03/10/2023 06/27/22   Delton Prairie, MD  oxyCODONE (ROXICODONE) 5 MG immediate release tablet Take 1 tablet (5 mg total) by mouth every 8 (eight) hours as needed. Patient not taking: Reported on 03/10/2023 06/27/22 06/27/23  Delton Prairie, MD    Family History  Problem Relation Age of Onset   Diabetes Maternal Grandfather    Diabetes Paternal Grandfather      Social History   Tobacco Use   Smoking status: Former  Smokeless tobacco: Never  Vaping Use   Vaping Use: Former   Quit date: 04/13/2021  Substance Use Topics   Alcohol use: Not Currently   Drug use: No    Allergies as of 05/07/2023   (No Known Allergies)    Review of Systems:    All systems reviewed and negative except where noted in HPI.   Observations/Objective:  Labs: CBC    Component Value Date/Time   WBC 10.4 06/26/2022 2207   RBC 4.07 06/26/2022 2207   HGB 12.5 06/26/2022 2207   HGB 12.0 01/24/2016 1542   HCT 35.6 (L) 06/26/2022 2207   HCT 34.3  01/24/2016 1542   PLT 233 06/26/2022 2207   PLT 223 01/24/2016 1542   MCV 87.5 06/26/2022 2207   MCV 82 01/24/2016 1542   MCV 79 (L) 12/20/2013 0532   MCH 30.7 06/26/2022 2207   MCHC 35.1 06/26/2022 2207   RDW 11.8 06/26/2022 2207   RDW 13.8 01/24/2016 1542   RDW 17.0 (H) 12/20/2013 0532   LYMPHSABS 2.0 05/30/2015 1543   LYMPHSABS 2.2 12/20/2013 0532   MONOABS 0.5 12/20/2013 0532   EOSABS 0.1 05/30/2015 1543   EOSABS 0.2 12/20/2013 0532   BASOSABS 0.0 05/30/2015 1543   BASOSABS 0.0 12/20/2013 0532   CMP     Component Value Date/Time   NA 136 06/26/2022 2207   NA 136 12/18/2013 0600   K 3.2 (L) 06/26/2022 2207   K 3.9 12/18/2013 0600   CL 101 06/26/2022 2207   CL 105 12/18/2013 0600   CO2 25 06/26/2022 2207   CO2 24 12/18/2013 0600   GLUCOSE 107 (H) 06/26/2022 2207   GLUCOSE 99 12/18/2013 0600   BUN 6 06/26/2022 2207   BUN 7 (L) 12/18/2013 0600   CREATININE 0.52 06/26/2022 2207   CREATININE 0.61 12/18/2013 0600   CALCIUM 9.4 06/26/2022 2207   CALCIUM 7.5 (L) 12/18/2013 0600   PROT 8.5 (H) 06/26/2022 2207   PROT 9.0 (H) 11/25/2012 1712   ALBUMIN 4.7 06/26/2022 2207   ALBUMIN 4.9 11/25/2012 1712   AST 30 06/26/2022 2207   AST 26 12/18/2013 0600   ALT 57 (H) 06/26/2022 2207   ALT 22 11/25/2012 1712   ALKPHOS 78 06/26/2022 2207   ALKPHOS 114 11/25/2012 1712   BILITOT 1.4 (H) 06/26/2022 2207   BILITOT 0.7 11/25/2012 1712   GFRNONAA >60 06/26/2022 2207   GFRNONAA >60 12/18/2013 0600   GFRAA >60 12/18/2013 0600    Imaging Studies: No results found.  Assessment and Plan:   Lisa Dominguez is a 27 y.o. y/o female has been referred for throat pain and presumed dysphagia.  The patient had mouth sores that have gone away.  The patient denies any problems with eating once the food goes down but states that she has pain in her throat and has to move her neck to different positions to help alleviate the pain when she swallows.  The patient has had some trouble getting  into ENT because of their backlog.  The patient has been told that she will be treated with a PPI just in case there is any acid reflux causing her throat pain and if that is the issue then she will let us know otherwise we will send out a referral to ENT to evaluate her for her throat pain.  The patient will be started on Protonix 40 mg once a day for a month.  The patient has been explained the plan and agrees with it  Follow  Up Instructions:  I discussed the assessment and treatment plan with the patient. The patient was provided an opportunity to ask questions and all were answered. The patient agreed with the plan and demonstrated an understanding of the instructions.   The patient was advised to call back or seek an in-person evaluation if the symptoms worsen or if the condition fails to improve as anticipated.  I provided 25 minutes of non-face-to-face time during this encounter including chart review In preparation for the encounter.   Lisa Minium, MD  Speech recognition software was used to dictate the above note.

## 2023-05-24 ENCOUNTER — Ambulatory Visit
Admission: EM | Admit: 2023-05-24 | Discharge: 2023-05-24 | Disposition: A | Discharge status: Home / Self Care | Payer: Medicaid Other | Attending: Emergency Medicine | Admitting: Emergency Medicine

## 2023-05-24 DIAGNOSIS — A6004 Herpesviral vulvovaginitis: Secondary | ICD-10-CM

## 2023-05-24 MED ORDER — VALACYCLOVIR HCL 1 G PO TABS: 1000.0000 mg | ORAL_TABLET | Freq: Two times a day (BID) | ORAL | 2 refills | Status: DC

## 2023-05-24 NOTE — ED Triage Notes (Addendum)
Pt presents to UC for refill on genital herpes med. States [facility] & was dx w/genital herpes back in feb. Has 1 lesion in vaginal area that is discomforting. Denies any discharge.

## 2023-05-24 NOTE — Discharge Instructions (Addendum)
Take the Valtrex twice daily with food for 10 days for treatment of your genital herpes outbreak.  Call [facility] and set up a new patient appointment such of a primary care provider who can manage your medications going forward.  Their phone number is [phone].

## 2023-05-24 NOTE — ED Provider Notes (Signed)
[facility]    CSN: [id number] Arrival date & time: 05/24/23  1132      History   Chief Complaint Chief Complaint  Patient presents with   Medication Refill    HPI Lisa Dominguez is a 27 y.o. female.   HPI  27 year old female with a past medical history of HSV presents for evaluation of a lesion on her left labia that she says feels like her previous herpes outbreak.  [facility] year and diagnosed with herpes and started on medication but she is unsure of what medication she was prescribed.  States she took it several times a day.  The current lesion ruptured yesterday and it is both burning and itching.  She denies any vaginal discharge or itching.  No urinary symptoms.  Past Medical History:  Diagnosis Date   Mild pre-eclampsia    Pregnancy induced hypertension    1st pregnancy   Prolonged spontaneous rupture of membranes     Patient Active Problem List   Diagnosis Date Noted   Vitamin D deficiency 01/25/2016   Vaginal delivery 12/10/2015   History of pregnancy induced hypertension 06/09/2015    Past Surgical History:  Procedure Laterality Date   NO PAST SURGERIES      OB History     Gravida  2   Para  2   Term  2   Preterm      AB      Living  1      SAB      IAB      Ectopic      Multiple  0   Live Births  1            Home Medications    Prior to Admission medications   Medication Sig Start Date End Date Taking? Authorizing Provider  valACYclovir (VALTREX) 1000 MG tablet Take 1 tablet (1,000 mg total) by mouth 2 (two) times daily. 05/24/23  Yes Becky Augusta, NP    Family History Family History  Problem Relation Age of Onset   Diabetes Maternal Grandfather    Diabetes Paternal Grandfather     Social History Social History   Tobacco Use   Smoking status: Former   Smokeless tobacco: Never  Vaping Use   Vaping Use: Former   Quit date: 04/13/2021  Substance Use  Topics   Alcohol use: Not Currently   Drug use: No     Allergies   Patient has no known allergies.   Review of Systems Review of Systems  Constitutional:  Negative for fever.  Genitourinary:  Positive for genital sores. Negative for dysuria, frequency, hematuria, urgency, vaginal discharge and vaginal pain.     Physical Exam Triage Vital Signs ED Triage Vitals  Enc Vitals Group     BP --      Pulse --      Resp 05/24/23 1134 16     Temp --      Temp Source 05/24/23 1134 Oral     SpO2 --      Weight 05/24/23 1134 150 lb (68 kg)     Height 05/24/23 1134 5\' 1"  (1.549 m)     Head Circumference --      Peak Flow --      Pain Score 05/24/23 1139 0     Pain Loc --      Pain Edu? --      Excl. in GC? --  No data found.  Updated Vital Signs BP 115/81 (BP Location: Left Arm)   Pulse 72   Temp 98.5 F (36.9 C) (Oral)   Resp 16   Ht 5\' 1"  (1.549 m)   Wt 150 lb (68 kg)   SpO2 97%   BMI 28.34 kg/m   Visual Acuity Right Eye Distance:   Left Eye Distance:   Bilateral Distance:    Right Eye Near:   Left Eye Near:    Bilateral Near:     Physical Exam Vitals and nursing note reviewed. Exam conducted with a chaperone present Jacklynn Lewis, RN).  Constitutional:      Appearance: Normal appearance. She is not ill-appearing.  Genitourinary:    Vagina: No vaginal discharge.     Comments: Patient has a cluster of vesicular lesions on the inferior aspect of the left labia.  There is also a single vesicular lesion on the inferior aspect of the right labia. Skin:    General: Skin is warm and dry.     Capillary Refill: Capillary refill takes less than 2 seconds.  Neurological:     General: No focal deficit present.     Mental Status: She is alert and oriented to person, place, and time.      UC Treatments / Results  Labs (all labs ordered are listed, but only abnormal results are displayed) Labs Reviewed - No data to display  EKG   Radiology No results  found.  Procedures Procedures (including critical care time)  Medications Ordered in UC Medications - No data to display  Initial Impression / Assessment and Plan / UC Course  I have reviewed the triage vital signs and the nursing notes.  Pertinent labs & imaging results that were available during my care of the patient were reviewed by me and considered in my medical decision making (see chart for details).   Patient is a pleasant, nontoxic-appearing 27 year old female presenting with request for refill of medication to treat her genital herpes.  She is currently experiencing an outbreak that started yesterday.  With Heather the RN as chaperone I did have visual inspection which revealed a cluster of 4 vesicular lesions on the inferior aspect of the left labia and a single lesion on the inferior aspect of the right labia.  Both are tender to touch.  Both are consistent with HSV outbreak.  I will discharge her home on Valtrex 1000 mg twice daily for 10 days.  I will also give a refill.  She currently does not have a PCP but she was recently seen at [facility] family practice she is looking to switch to [facility] medical.   Final Clinical Impressions(s) / UC Diagnoses   Final diagnoses:  Herpes simplex vulvovaginitis     Discharge Instructions      Take the Valtrex twice daily with food for 10 days for treatment of your genital herpes outbreak.  Call [facility] and set up a new patient appointment such of a primary care provider who can manage your medications going forward.  Their phone number is [phone].     ED Prescriptions     Medication Sig Dispense Auth. Provider   valACYclovir (VALTREX) 1000 MG tablet Take 1 tablet (1,000 mg total) by mouth 2 (two) times daily. 20 tablet Becky Augusta, NP      PDMP not reviewed this encounter.   Becky Augusta, NP 05/24/23 1158

## 2023-06-30 ENCOUNTER — Encounter: Payer: Self-pay | Admitting: Physician Assistant

## 2023-06-30 ENCOUNTER — Ambulatory Visit (INDEPENDENT_AMBULATORY_CARE_PROVIDER_SITE_OTHER): Payer: Medicaid Other | Admitting: Physician Assistant

## 2023-06-30 VITALS — BP 114/78 | HR 85 | Temp 98.5°F | Ht 61.0 in | Wt 151.0 lb

## 2023-06-30 DIAGNOSIS — B009 Herpesviral infection, unspecified: Secondary | ICD-10-CM

## 2023-06-30 DIAGNOSIS — K6289 Other specified diseases of anus and rectum: Secondary | ICD-10-CM

## 2023-06-30 MED ORDER — VALACYCLOVIR HCL 1 G PO TABS: 1000.0000 mg | ORAL_TABLET | Freq: Two times a day (BID) | ORAL | 0 refills | Status: AC

## 2023-06-30 NOTE — Progress Notes (Signed)
Date:  06/30/2023   Name:  Lisa Dominguez   DOB:  1996-06-13   MRN:  [id number]   Chief Complaint: Establish Care and Rectal Pain (X 2 weeks,  Pt is not constipated but she's a dot of blood when whipping, 7 on pain scale, doesn't think she has hemorrhoids, felt like a pimple was there but it is gone now, pt feels uncomfortable/)  HPI Ayame is a very pleasant 27 year old female with a history of HSV new to the practice today to evaluate complaint of anal pain for 2 weeks with scant bleeding.  Saw urgent care downstairs 05/24/2023 treated for HSV outbreak of the labia using Valacyclovir which was helpful per patient.  Originally diagnosed with HSV by an external urgent care in February 2024 "with a swab", though I cannot see these notes or results.    Medication list has been reviewed and updated.  Current Meds  Medication Sig   valACYclovir (VALTREX) 1000 MG tablet Take 1 tablet (1,000 mg total) by mouth 2 (two) times daily for 10 days.   [DISCONTINUED] valACYclovir (VALTREX) 1000 MG tablet Take 1 tablet (1,000 mg total) by mouth 2 (two) times daily.     Review of Systems  Constitutional:  Negative for fatigue and fever.  Respiratory:  Negative for chest tightness and shortness of breath.   Cardiovascular:  Negative for chest pain and palpitations.  Gastrointestinal:  Negative for abdominal pain.       Anal pain    Patient Active Problem List   Diagnosis Date Noted   Calculus of gallbladder without cholecystitis without obstruction 03/13/2023   Vitamin D deficiency 01/25/2016   Vaginal delivery 12/10/2015   History of pregnancy induced hypertension 06/09/2015    No Known Allergies  Immunization History  Administered Date(s) Administered   Influenza,inj,Quad PF,6+ Mos 09/05/2015   Tdap 09/27/2015    Past Surgical History:  Procedure Laterality Date   CHOLECYSTECTOMY     NO PAST SURGERIES      Social History   Tobacco Use   Smoking status: Former    Packs/day:  0.25    Years: 1.50    Additional pack years: 0.00    Total pack years: 0.38    Types: Cigarettes    Quit date: 12/23/2020    Years since quitting: 2.5   Smokeless tobacco: Never  Vaping Use   Vaping Use: Former   Quit date: 04/13/2021  Substance Use Topics   Alcohol use: Yes    Comment: ocaassionally   Drug use: No    Family History  Problem Relation Age of Onset   Hypertension Mother    Cancer Paternal Aunt    Diabetes Maternal Grandfather    Hypertension Paternal Grandmother    Hypertension Paternal Grandfather    Diabetes Paternal Grandfather          No data to display             03/10/2023    2:15 PM 01/24/2016    2:54 PM  Depression screen PHQ 2/9  Decreased Interest 2 0  Down, Depressed, Hopeless 1 0  PHQ - 2 Score 3 0  Altered sleeping 1   Tired, decreased energy 1   Change in appetite 0   Feeling bad or failure about yourself  0   Trouble concentrating 0   Moving slowly or fidgety/restless 0   Suicidal thoughts 0   PHQ-9 Score 5   Difficult doing work/chores Not difficult at all  BP Readings from Last 3 Encounters:  06/30/23 114/78  05/24/23 115/81  03/10/23 131/77    Wt Readings from Last 3 Encounters:  06/30/23 151 lb (68.5 kg)  05/24/23 150 lb (68 kg)  05/07/23 165 lb (74.8 kg)    BP 114/78   Pulse 85   Temp 98.5 F (36.9 C) (Oral)   Ht 5\' 1"  (1.549 m)   Wt 151 lb (68.5 kg)   SpO2 99%   BMI 28.53 kg/m   Physical Exam Vitals and nursing note reviewed.  Constitutional:      Appearance: Normal appearance.  Cardiovascular:     Rate and Rhythm: Normal rate.  Pulmonary:     Effort: Pulmonary effort is normal.  Abdominal:     General: There is no distension.  Genitourinary:    Comments: Limited inspection of anus largely unrevealing, no discrete lesions or blood appreciated, though there is a small tender macule just left of anus which could represent a healing lesion.  Genital and rectal exam deferred. Musculoskeletal:         General: Normal range of motion.  Skin:    General: Skin is warm and dry.  Neurological:     Mental Status: She is alert and oriented to person, place, and time.     Gait: Gait is intact.  Psychiatric:        Mood and Affect: Mood and affect normal.     Recent Labs     Component Value Date/Time   NA 136 06/26/2022 2207   NA 136 12/18/2013 0600   K 3.2 (L) 06/26/2022 2207   K 3.9 12/18/2013 0600   CL 101 06/26/2022 2207   CL 105 12/18/2013 0600   CO2 25 06/26/2022 2207   CO2 24 12/18/2013 0600   GLUCOSE 107 (H) 06/26/2022 2207   GLUCOSE 99 12/18/2013 0600   BUN 6 06/26/2022 2207   BUN 7 (L) 12/18/2013 0600   CREATININE 0.52 06/26/2022 2207   CREATININE 0.61 12/18/2013 0600   CALCIUM 9.4 06/26/2022 2207   CALCIUM 7.5 (L) 12/18/2013 0600   PROT 8.5 (H) 06/26/2022 2207   PROT 9.0 (H) 11/25/2012 1712   ALBUMIN 4.7 06/26/2022 2207   ALBUMIN 4.9 11/25/2012 1712   AST 30 06/26/2022 2207   AST 26 12/18/2013 0600   ALT 57 (H) 06/26/2022 2207   ALT 22 11/25/2012 1712   ALKPHOS 78 06/26/2022 2207   ALKPHOS 114 11/25/2012 1712   BILITOT 1.4 (H) 06/26/2022 2207   BILITOT 0.7 11/25/2012 1712   GFRNONAA >60 06/26/2022 2207   GFRNONAA >60 12/18/2013 0600   GFRAA >60 12/18/2013 0600    Lab Results  Component Value Date   WBC 10.4 06/26/2022   HGB 12.5 06/26/2022   HCT 35.6 (L) 06/26/2022   MCV 87.5 06/26/2022   PLT 233 06/26/2022   Lab Results  Component Value Date   HGBA1C 5.5 05/30/2015   No results found for: "CHOL", "HDL", "LDLCALC", "LDLDIRECT", "TRIG", "CHOLHDL" No results found for: "TSH"   Assessment and Plan:  1. Anal pain Likely anal fissure versus HSV outbreak.  Will treat for HSV and monitor for improvement.  Follow-up in 2 weeks, may cancel if completely resolved, though she will need routine physical in the near future.  2. Herpes simplex Plan as above.  Asked patient to retrieve her records from the external urgent care and bring to [place] for  documentation.  Discussed option for antiviral suppressive therapy if outbreaks are frequent and bothersome.  - valACYclovir (  VALTREX) 1000 MG tablet; Take 1 tablet (1,000 mg total) by mouth 2 (two) times daily for 10 days.  Dispense: 20 tablet; Refill: 0   Return in about 2 weeks (around 07/14/2023) for OV f/u HSV? Marland Kitchen   Partially dictated using Animal nutritionist. Any errors are unintentional.  Alvester Morin, PA-C, DMSc, Nutritionist [facility] [phone]

## 2023-06-30 NOTE — Patient Instructions (Signed)
-  It was a pleasure to see you today! Please review your visit summary for helpful information -Lab results are usually available within 1-2 days and we will call once reviewed -I would encourage you to follow your care via MyChart where you can access lab results, notes, messages, and more -If you feel that we did a nice job today, please complete your after-visit survey and leave us a Google review! Your CMA today was Kieandra and your provider was Dan Karizma Cheek, PA-C, DMSc -Please return for follow-up in about 2 weeks 

## 2023-07-14 ENCOUNTER — Ambulatory Visit: Payer: Medicaid Other | Admitting: Physician Assistant

## 2023-07-14 ENCOUNTER — Encounter: Payer: Self-pay | Admitting: Physician Assistant

## 2023-07-24 ENCOUNTER — Ambulatory Visit: Payer: Medicaid Other | Admitting: Physician Assistant
